# Patient Record
Sex: Female | Born: 1980 | Race: Black or African American | Hispanic: No | Marital: Married | Smoking: Never smoker
Health system: Southern US, Community
[De-identification: ages and names within clinical notes are randomized; demographics above are authoritative.]

## PROBLEM LIST (undated history)

## (undated) DIAGNOSIS — B009 Herpesviral infection, unspecified: Secondary | ICD-10-CM

## (undated) DIAGNOSIS — R519 Headache, unspecified: Secondary | ICD-10-CM

## (undated) DIAGNOSIS — D649 Anemia, unspecified: Secondary | ICD-10-CM

## (undated) DIAGNOSIS — O24419 Gestational diabetes mellitus in pregnancy, unspecified control: Secondary | ICD-10-CM

## (undated) DIAGNOSIS — B019 Varicella without complication: Secondary | ICD-10-CM

## (undated) DIAGNOSIS — E119 Type 2 diabetes mellitus without complications: Secondary | ICD-10-CM

## (undated) DIAGNOSIS — R87619 Unspecified abnormal cytological findings in specimens from cervix uteri: Secondary | ICD-10-CM

## (undated) DIAGNOSIS — R51 Headache: Secondary | ICD-10-CM

## (undated) DIAGNOSIS — IMO0002 Reserved for concepts with insufficient information to code with codable children: Secondary | ICD-10-CM

## (undated) HISTORY — DX: Reserved for concepts with insufficient information to code with codable children: IMO0002

## (undated) HISTORY — DX: Unspecified abnormal cytological findings in specimens from cervix uteri: R87.619

## (undated) HISTORY — PX: SALPINGOOPHORECTOMY: SHX82

## (undated) HISTORY — DX: Anemia, unspecified: D64.9

## (undated) HISTORY — DX: Type 2 diabetes mellitus without complications: E11.9

## (undated) HISTORY — DX: Gestational diabetes mellitus in pregnancy, unspecified control: O24.419

## (undated) HISTORY — PX: COLPOSCOPY: SHX161

## (undated) HISTORY — DX: Varicella without complication: B01.9

## (undated) HISTORY — DX: Headache: R51

## (undated) HISTORY — DX: Headache, unspecified: R51.9

## (undated) HISTORY — DX: Herpesviral infection, unspecified: B00.9

---

## 2002-05-15 ENCOUNTER — Emergency Department (HOSPITAL_COMMUNITY): Admission: EM | Admit: 2002-05-15 | Discharge: 2002-05-15 | Payer: Self-pay | Admitting: Emergency Medicine

## 2002-05-15 ENCOUNTER — Encounter: Payer: Self-pay | Admitting: Emergency Medicine

## 2004-04-10 ENCOUNTER — Emergency Department (HOSPITAL_COMMUNITY): Admission: EM | Admit: 2004-04-10 | Discharge: 2004-04-10 | Payer: Self-pay | Admitting: Emergency Medicine

## 2006-12-06 ENCOUNTER — Other Ambulatory Visit: Admission: RE | Admit: 2006-12-06 | Discharge: 2006-12-06 | Payer: Self-pay | Admitting: Obstetrics and Gynecology

## 2008-05-04 ENCOUNTER — Other Ambulatory Visit: Admission: RE | Admit: 2008-05-04 | Discharge: 2008-05-04 | Payer: Self-pay | Admitting: Obstetrics and Gynecology

## 2008-10-15 ENCOUNTER — Ambulatory Visit: Payer: Self-pay | Admitting: Physician Assistant

## 2008-10-15 ENCOUNTER — Inpatient Hospital Stay (HOSPITAL_COMMUNITY): Admission: AD | Admit: 2008-10-15 | Discharge: 2008-10-15 | Payer: Self-pay | Admitting: Obstetrics and Gynecology

## 2009-04-24 ENCOUNTER — Inpatient Hospital Stay (HOSPITAL_COMMUNITY): Admission: AD | Admit: 2009-04-24 | Discharge: 2009-04-26 | Payer: Self-pay | Admitting: Obstetrics and Gynecology

## 2009-08-05 ENCOUNTER — Other Ambulatory Visit: Admission: RE | Admit: 2009-08-05 | Discharge: 2009-08-05 | Payer: Self-pay | Admitting: Obstetrics and Gynecology

## 2009-12-27 ENCOUNTER — Ambulatory Visit: Admission: RE | Admit: 2009-12-27 | Discharge: 2009-12-27 | Payer: Self-pay

## 2009-12-27 ENCOUNTER — Ambulatory Visit: Payer: Self-pay | Admitting: Vascular Surgery

## 2009-12-30 ENCOUNTER — Encounter: Admission: RE | Admit: 2009-12-30 | Discharge: 2009-12-30 | Payer: Self-pay | Admitting: Family Medicine

## 2010-10-23 LAB — CBC
HCT: 36.7 % (ref 36.0–46.0)
Hemoglobin: 12.2 g/dL (ref 12.0–15.0)
MCHC: 33.1 g/dL (ref 30.0–36.0)
MCHC: 33.2 g/dL (ref 30.0–36.0)
MCV: 91.6 fL (ref 78.0–100.0)
Platelets: 144 10*3/uL — ABNORMAL LOW (ref 150–400)
Platelets: 163 10*3/uL (ref 150–400)
RBC: 3.41 MIL/uL — ABNORMAL LOW (ref 3.87–5.11)
RBC: 4 MIL/uL (ref 3.87–5.11)
RDW: 15.3 % (ref 11.5–15.5)
WBC: 15.1 10*3/uL — ABNORMAL HIGH (ref 4.0–10.5)
WBC: 8.5 10*3/uL (ref 4.0–10.5)

## 2010-10-23 LAB — RPR: RPR Ser Ql: NONREACTIVE

## 2010-10-27 ENCOUNTER — Other Ambulatory Visit: Payer: Self-pay | Admitting: Obstetrics and Gynecology

## 2010-10-27 ENCOUNTER — Other Ambulatory Visit (HOSPITAL_COMMUNITY)
Admission: RE | Admit: 2010-10-27 | Discharge: 2010-10-27 | Disposition: A | Discharge status: Home / Self Care | Payer: Managed Care, Other (non HMO) | Source: Ambulatory Visit | Attending: Obstetrics and Gynecology | Admitting: Obstetrics and Gynecology

## 2010-10-27 DIAGNOSIS — Z113 Encounter for screening for infections with a predominantly sexual mode of transmission: Secondary | ICD-10-CM | POA: Insufficient documentation

## 2010-10-27 DIAGNOSIS — Z01419 Encounter for gynecological examination (general) (routine) without abnormal findings: Secondary | ICD-10-CM | POA: Insufficient documentation

## 2010-10-30 LAB — GC/CHLAMYDIA PROBE AMP, GENITAL
Chlamydia, DNA Probe: NEGATIVE
GC Probe Amp, Genital: NEGATIVE

## 2010-10-30 LAB — WET PREP, GENITAL: Clue Cells Wet Prep HPF POC: NONE SEEN

## 2011-11-26 ENCOUNTER — Other Ambulatory Visit (HOSPITAL_COMMUNITY)
Admission: RE | Admit: 2011-11-26 | Discharge: 2011-11-26 | Disposition: A | Discharge status: Home / Self Care | Payer: Managed Care, Other (non HMO) | Source: Ambulatory Visit | Attending: Obstetrics and Gynecology | Admitting: Obstetrics and Gynecology

## 2011-11-26 ENCOUNTER — Other Ambulatory Visit: Payer: Self-pay | Admitting: Obstetrics and Gynecology

## 2011-11-26 DIAGNOSIS — Z01419 Encounter for gynecological examination (general) (routine) without abnormal findings: Secondary | ICD-10-CM | POA: Insufficient documentation

## 2012-07-20 NOTE — L&D Delivery Note (Signed)
Delivery Note At 12:48 PM a viable female was delivered via Vaginal, Spontaneous Delivery (Presentation: ; Occiput Anterior).  APGAR: 9, 9; weight pending.   Placenta status: Intact, Spontaneous.  Cord: 3 vessels with the following complications: none.  Cord pH: not collected  Anesthesia: Epidural  Episiotomy: None Lacerations: None Suture Repair: n/a Est. Blood Loss (mL): 200  Mom to postpartum.  Baby to skin to skin immediately after delivery.  Routine postpartum orders Plans outpatient circ Pump and bottlefeed  Matthews Franks 03/26/2013, 1:41 PM

## 2012-08-26 LAB — OB RESULTS CONSOLE GC/CHLAMYDIA
Chlamydia: NEGATIVE
Gonorrhea: NEGATIVE

## 2012-08-26 LAB — OB RESULTS CONSOLE ANTIBODY SCREEN: Antibody Screen: NEGATIVE

## 2012-08-26 LAB — OB RESULTS CONSOLE HEPATITIS B SURFACE ANTIGEN: Hepatitis B Surface Ag: NEGATIVE

## 2012-08-26 LAB — OB RESULTS CONSOLE HIV ANTIBODY (ROUTINE TESTING): HIV: NONREACTIVE

## 2012-08-26 LAB — OB RESULTS CONSOLE RUBELLA ANTIBODY, IGM: Rubella: IMMUNE

## 2013-02-01 ENCOUNTER — Encounter: Payer: Managed Care, Other (non HMO) | Attending: Obstetrics and Gynecology | Admitting: *Deleted

## 2013-02-01 VITALS — Ht 64.0 in | Wt 237.4 lb

## 2013-02-01 DIAGNOSIS — Z713 Dietary counseling and surveillance: Secondary | ICD-10-CM | POA: Insufficient documentation

## 2013-02-01 DIAGNOSIS — O24419 Gestational diabetes mellitus in pregnancy, unspecified control: Secondary | ICD-10-CM

## 2013-02-01 DIAGNOSIS — O99810 Abnormal glucose complicating pregnancy: Secondary | ICD-10-CM | POA: Insufficient documentation

## 2013-02-02 ENCOUNTER — Encounter: Payer: Self-pay | Admitting: *Deleted

## 2013-02-02 NOTE — Progress Notes (Signed)
  Patient was seen on 02/01/13 for Gestational Diabetes self-management class at the [facility]. The following learning objectives were met by the patient during this course:   States the definition of Gestational Diabetes  States why dietary management is important in controlling blood glucose  Describes the effects each nutrient has on blood glucose levels  Demonstrates ability to create a balanced meal plan  Demonstrates carbohydrate counting   States when to check blood glucose levels  Demonstrates proper blood glucose monitoring techniques  States the effect of stress and exercise on blood glucose levels  States the importance of limiting caffeine and abstaining from alcohol and smoking  Blood glucose monitor given: Accu Chek Nano BG Monitoring Kit Lot # [id number] Exp: 04/18/14 Blood glucose reading: 85 mg/dl  Patient instructed to monitor glucose levels: FBS: 60 - <90 1 hour: <140 2 hour: <120  *Patient received handouts:  Nutrition Diabetes and Pregnancy  Carbohydrate Counting List  Patient will be seen for follow-up as needed.

## 2013-02-02 NOTE — Patient Instructions (Signed)
Goals:  Check glucose levels per MD as instructed  Follow Gestational Diabetes Diet as instructed  Call for follow-up as needed    

## 2013-03-24 ENCOUNTER — Telehealth (HOSPITAL_COMMUNITY): Payer: Self-pay | Admitting: *Deleted

## 2013-03-24 ENCOUNTER — Encounter (HOSPITAL_COMMUNITY): Payer: Self-pay | Admitting: *Deleted

## 2013-03-24 NOTE — Telephone Encounter (Signed)
Preadmission screen  

## 2013-03-26 ENCOUNTER — Encounter (HOSPITAL_COMMUNITY): Payer: Self-pay | Admitting: Anesthesiology

## 2013-03-26 ENCOUNTER — Inpatient Hospital Stay (HOSPITAL_COMMUNITY)
Admission: AD | Admit: 2013-03-26 | Discharge: 2013-03-28 | DRG: 775 | Disposition: A | Discharge status: Home / Self Care | Payer: Managed Care, Other (non HMO) | Source: Ambulatory Visit | Attending: Obstetrics and Gynecology | Admitting: Obstetrics and Gynecology

## 2013-03-26 ENCOUNTER — Inpatient Hospital Stay (HOSPITAL_COMMUNITY): Payer: Managed Care, Other (non HMO) | Admitting: Anesthesiology

## 2013-03-26 ENCOUNTER — Encounter (HOSPITAL_COMMUNITY): Payer: Self-pay | Admitting: *Deleted

## 2013-03-26 DIAGNOSIS — Z9851 Tubal ligation status: Secondary | ICD-10-CM

## 2013-03-26 DIAGNOSIS — O99892 Other specified diseases and conditions complicating childbirth: Principal | ICD-10-CM | POA: Diagnosis present

## 2013-03-26 DIAGNOSIS — Z22330 Carrier of Group B streptococcus: Secondary | ICD-10-CM

## 2013-03-26 LAB — GLUCOSE, CAPILLARY
Glucose-Capillary: 83 mg/dL (ref 70–99)
Glucose-Capillary: 74 mg/dL (ref 70–99)

## 2013-03-26 LAB — CBC
HCT: 33 % — ABNORMAL LOW (ref 36.0–46.0)
MCV: 81.8 fL (ref 78.0–100.0)
Platelets: 157 10*3/uL (ref 150–400)
RBC: 3.9 MIL/uL (ref 3.87–5.11)
RDW: 15.4 % (ref 11.5–15.5)
WBC: 6.7 10*3/uL (ref 4.0–10.5)
WBC: 7.6 10*3/uL (ref 4.0–10.5)

## 2013-03-26 LAB — ABO/RH: ABO/RH(D): A POS

## 2013-03-26 LAB — TYPE AND SCREEN: ABO/RH(D): A POS

## 2013-03-26 MED ORDER — PHENYLEPHRINE 40 MCG/ML (10ML) SYRINGE FOR IV PUSH (FOR BLOOD PRESSURE SUPPORT)
80.0000 ug | PREFILLED_SYRINGE | INTRAVENOUS | Status: DC | PRN
  Filled 2013-03-26: qty 2

## 2013-03-26 MED ORDER — PHENYLEPHRINE 40 MCG/ML (10ML) SYRINGE FOR IV PUSH (FOR BLOOD PRESSURE SUPPORT)
80.0000 ug | PREFILLED_SYRINGE | INTRAVENOUS | Status: DC | PRN
  Filled 2013-03-26: qty 2
  Filled 2013-03-26: qty 5

## 2013-03-26 MED ORDER — FAMOTIDINE 20 MG PO TABS
40.0000 mg | ORAL_TABLET | Freq: Once | ORAL | Status: AC
  Administered 2013-03-28: 40 mg via ORAL
  Filled 2013-03-26: qty 2

## 2013-03-26 MED ORDER — OXYTOCIN 40 UNITS IN LACTATED RINGERS INFUSION - SIMPLE MED
1.0000 m[IU]/min | INTRAVENOUS | Status: DC
  Administered 2013-03-26: 1 m[IU]/min via INTRAVENOUS
  Administered 2013-03-26: 7 m[IU]/min via INTRAVENOUS
  Filled 2013-03-26: qty 1000

## 2013-03-26 MED ORDER — LACTATED RINGERS IV SOLN
INTRAVENOUS | Status: DC
Start: 2013-03-26 — End: 2013-03-26
  Administered 2013-03-26: 03:00:00 via INTRAVENOUS

## 2013-03-26 MED ORDER — DIBUCAINE 1 % RE OINT: 1.0000 "application " | TOPICAL_OINTMENT | RECTAL | Status: DC | PRN

## 2013-03-26 MED ORDER — PENICILLIN G POTASSIUM 5000000 UNITS IJ SOLR
5.0000 10*6.[IU] | Freq: Once | INTRAVENOUS | Status: AC
  Administered 2013-03-26: 5 10*6.[IU] via INTRAVENOUS
  Filled 2013-03-26: qty 5

## 2013-03-26 MED ORDER — WITCH HAZEL-GLYCERIN EX PADS: 1.0000 "application " | MEDICATED_PAD | CUTANEOUS | Status: DC | PRN

## 2013-03-26 MED ORDER — LANOLIN HYDROUS EX OINT: TOPICAL_OINTMENT | CUTANEOUS | Status: DC | PRN

## 2013-03-26 MED ORDER — SIMETHICONE 80 MG PO CHEW: 80.0000 mg | CHEWABLE_TABLET | ORAL | Status: DC | PRN

## 2013-03-26 MED ORDER — METOCLOPRAMIDE HCL 10 MG PO TABS
10.0000 mg | ORAL_TABLET | Freq: Once | ORAL | Status: AC
  Administered 2013-03-28: 10 mg via ORAL
  Filled 2013-03-26: qty 1

## 2013-03-26 MED ORDER — LACTATED RINGERS IV SOLN
500.0000 mL | INTRAVENOUS | Status: DC | PRN
  Administered 2013-03-26: 500 mL via INTRAVENOUS

## 2013-03-26 MED ORDER — LACTATED RINGERS IV SOLN
INTRAVENOUS | Status: DC
  Administered 2013-03-28: 11:00:00 via INTRAVENOUS

## 2013-03-26 MED ORDER — PRENATAL MULTIVITAMIN CH
1.0000 | ORAL_TABLET | Freq: Every day | ORAL | Status: DC
  Administered 2013-03-27 – 2013-03-28 (×2): 1 via ORAL
  Filled 2013-03-26 (×2): qty 1

## 2013-03-26 MED ORDER — DIPHENHYDRAMINE HCL 25 MG PO CAPS: 25.0000 mg | ORAL_CAPSULE | Freq: Four times a day (QID) | ORAL | Status: DC | PRN

## 2013-03-26 MED ORDER — FENTANYL CITRATE 0.05 MG/ML IJ SOLN
100.0000 ug | INTRAMUSCULAR | Status: DC | PRN
  Administered 2013-03-26: 100 ug via INTRAVENOUS
  Filled 2013-03-26: qty 2

## 2013-03-26 MED ORDER — EPHEDRINE 5 MG/ML INJ
10.0000 mg | INTRAVENOUS | Status: DC | PRN
  Filled 2013-03-26: qty 2
  Filled 2013-03-26: qty 4

## 2013-03-26 MED ORDER — ACETAMINOPHEN 325 MG PO TABS: 650.0000 mg | ORAL_TABLET | ORAL | Status: DC | PRN

## 2013-03-26 MED ORDER — ONDANSETRON HCL 4 MG/2ML IJ SOLN: 4.0000 mg | INTRAMUSCULAR | Status: DC | PRN

## 2013-03-26 MED ORDER — LACTATED RINGERS IV SOLN
INTRAVENOUS | Status: DC
  Administered 2013-03-26: 08:00:00 via INTRAVENOUS

## 2013-03-26 MED ORDER — ONDANSETRON HCL 4 MG PO TABS: 4.0000 mg | ORAL_TABLET | ORAL | Status: DC | PRN

## 2013-03-26 MED ORDER — LIDOCAINE HCL (PF) 1 % IJ SOLN
INTRAMUSCULAR | Status: DC | PRN
  Administered 2013-03-26 (×2): 5 mL

## 2013-03-26 MED ORDER — OXYCODONE-ACETAMINOPHEN 5-325 MG PO TABS: 1.0000 | ORAL_TABLET | ORAL | Status: DC | PRN

## 2013-03-26 MED ORDER — TERBUTALINE SULFATE 1 MG/ML IJ SOLN
INTRAMUSCULAR | Status: AC
  Administered 2013-03-26: 0.25 mg via SUBCUTANEOUS
  Filled 2013-03-26: qty 1

## 2013-03-26 MED ORDER — CITRIC ACID-SODIUM CITRATE 334-500 MG/5ML PO SOLN: 30.0000 mL | ORAL | Status: DC | PRN

## 2013-03-26 MED ORDER — EPHEDRINE 5 MG/ML INJ
10.0000 mg | INTRAVENOUS | Status: DC | PRN
  Filled 2013-03-26: qty 2

## 2013-03-26 MED ORDER — ONDANSETRON HCL 4 MG/2ML IJ SOLN
4.0000 mg | Freq: Four times a day (QID) | INTRAMUSCULAR | Status: DC | PRN
  Administered 2013-03-26: 4 mg via INTRAVENOUS
  Filled 2013-03-26: qty 2

## 2013-03-26 MED ORDER — BENZOCAINE-MENTHOL 20-0.5 % EX AERO: 1.0000 "application " | INHALATION_SPRAY | CUTANEOUS | Status: DC | PRN

## 2013-03-26 MED ORDER — OXYCODONE-ACETAMINOPHEN 5-325 MG PO TABS
1.0000 | ORAL_TABLET | ORAL | Status: DC | PRN
  Administered 2013-03-28: 2 via ORAL
  Filled 2013-03-26: qty 2

## 2013-03-26 MED ORDER — TETANUS-DIPHTH-ACELL PERTUSSIS 5-2.5-18.5 LF-MCG/0.5 IM SUSP: 0.5000 mL | Freq: Once | INTRAMUSCULAR | Status: DC

## 2013-03-26 MED ORDER — FENTANYL 2.5 MCG/ML BUPIVACAINE 1/10 % EPIDURAL INFUSION (WH - ANES)
14.0000 mL/h | INTRAMUSCULAR | Status: DC | PRN
  Administered 2013-03-26: 14 mL/h via EPIDURAL
  Filled 2013-03-26: qty 125

## 2013-03-26 MED ORDER — OXYTOCIN 40 UNITS IN LACTATED RINGERS INFUSION - SIMPLE MED: 62.5000 mL/h | INTRAVENOUS | Status: DC

## 2013-03-26 MED ORDER — IBUPROFEN 600 MG PO TABS: 600.0000 mg | ORAL_TABLET | Freq: Four times a day (QID) | ORAL | Status: DC | PRN

## 2013-03-26 MED ORDER — LACTATED RINGERS IV SOLN: 500.0000 mL | Freq: Once | INTRAVENOUS | Status: DC

## 2013-03-26 MED ORDER — OXYTOCIN BOLUS FROM INFUSION
500.0000 mL | INTRAVENOUS | Status: DC
Start: 2013-03-26 — End: 2013-03-26

## 2013-03-26 MED ORDER — IBUPROFEN 600 MG PO TABS
600.0000 mg | ORAL_TABLET | Freq: Four times a day (QID) | ORAL | Status: DC
  Administered 2013-03-26 – 2013-03-28 (×9): 600 mg via ORAL
  Filled 2013-03-26 (×10): qty 1

## 2013-03-26 MED ORDER — DIPHENHYDRAMINE HCL 50 MG/ML IJ SOLN: 12.5000 mg | INTRAMUSCULAR | Status: DC | PRN

## 2013-03-26 MED ORDER — SENNOSIDES-DOCUSATE SODIUM 8.6-50 MG PO TABS
2.0000 | ORAL_TABLET | Freq: Every day | ORAL | Status: DC
  Administered 2013-03-26 – 2013-03-27 (×2): 2 via ORAL

## 2013-03-26 MED ORDER — DEXTROSE 5 % IV SOLN
2.5000 10*6.[IU] | INTRAVENOUS | Status: DC
  Administered 2013-03-26: 2.5 10*6.[IU] via INTRAVENOUS
  Filled 2013-03-26 (×5): qty 2.5

## 2013-03-26 MED ORDER — ZOLPIDEM TARTRATE 5 MG PO TABS: 5.0000 mg | ORAL_TABLET | Freq: Every evening | ORAL | Status: DC | PRN

## 2013-03-26 MED ORDER — LIDOCAINE HCL (PF) 1 % IJ SOLN
30.0000 mL | INTRAMUSCULAR | Status: DC | PRN
  Filled 2013-03-26 (×2): qty 30

## 2013-03-26 NOTE — MAU Note (Signed)
Pt reports uc's q 4-6 minutes for 1.5 hours. Rates pain 6/10

## 2013-03-26 NOTE — Progress Notes (Signed)
  Subjective: Pt reports feeling much better now that epidural is placed.  Pitocin initiated d/t no cervical change in over 5 hours.  Pt verbalizes understanding and wishes to proceed.  Objective: BP 118/73  Pulse 75  Temp(Src) 98.8 F (37.1 C) (Oral)  Resp 20  Ht 5\' 4"  (1.626 m)  Wt 236 lb (107.049 kg)  BMI 40.49 kg/m2  LMP 06/22/2012      FHT:  Cat I UC:   irregular, every 5-7 minutes  SVE:   Dilation: 3 Effacement (%): 80 Station: -2;-3 Exam by:: McGrailRN  Assessment / Plan:  Labor: Augmented labor Preeclampsia: No s/s Fetal Wellbeing: Cat I Pain Control: Epidural I/D: GBS +; PCN initated at 0630; Intact; Afibrile  Anticipated MOD: SVD   Zoe Randall 03/26/2013, 8:52 AM

## 2013-03-26 NOTE — Anesthesia Preprocedure Evaluation (Signed)
Anesthesia Evaluation  Patient identified by MRN, date of birth, ID band Patient awake    Reviewed: Allergy & Precautions, H&P , Patient's Chart, lab work & pertinent test results  Airway Mallampati: III TM Distance: >3 FB Neck ROM: full    Dental no notable dental hx.    Pulmonary neg pulmonary ROS,  breath sounds clear to auscultation  Pulmonary exam normal       Cardiovascular negative cardio ROS  Rhythm:regular Rate:Normal     Neuro/Psych negative neurological ROS  negative psych ROS   GI/Hepatic negative GI ROS, Neg liver ROS,   Endo/Other  negative endocrine ROSdiabetesMorbid obesity  Renal/GU negative Renal ROS     Musculoskeletal   Abdominal   Peds  Hematology negative hematology ROS (+)   Anesthesia Other Findings Diabetes mellitus without complication           Gestational diabetes     Herpes    Reproductive/Obstetrics (+) Pregnancy                           Anesthesia Physical Anesthesia Plan  ASA: III  Anesthesia Plan: Epidural   Post-op Pain Management:    Induction:   Airway Management Planned:   Additional Equipment:   Intra-op Plan:   Post-operative Plan:   Informed Consent: I have reviewed the patients History and Physical, chart, labs and discussed the procedure including the risks, benefits and alternatives for the proposed anesthesia with the patient or authorized representative who has indicated his/her understanding and acceptance.     Plan Discussed with:   Anesthesia Plan Comments:         Anesthesia Quick Evaluation

## 2013-03-26 NOTE — MAU Note (Signed)
Plan of care d/w Lillard. HAve pt walk for 1-2 hours then return to MAU for evaluation

## 2013-03-26 NOTE — H&P (Signed)
Zoe Randall is a 32 y.o. female presenting for labor eval. Pt reports some bloody show, denies any LOF, +FM. Cervi initially 1.5cm/thick and posterior, after ambulating rehceck of cervix =3cm/60cm and less posterior. Pt requesting epidural  HPI: Pt began [facility] w Dr Richardson Dopp at   [facility] History:  Reason for admission: Contractions.   Contractions: Onset was 6-12 hours ago.   Frequency: regular.   Duration is approximately 60 seconds.   Perceived severity is moderate.    Fetal activity: Perceived fetal activity is normal.   Last perceived fetal movement was within the past hour.    Prenatal Complications - Diabetes: gestational. Diabetes is managed by diet.      OB History   Grav Para Term Preterm Abortions TAB SAB Ect Mult Living   3 2 2       2      G1 -  G2 -  G3 - current preg   Past Medical History  Diagnosis Date  . Diabetes mellitus without complication   . Abnormal Pap smear   . Gestational diabetes   . Herpes    Past Surgical History  Procedure Laterality Date  . Colposcopy     Family History: family history includes Hypertension in her maternal grandmother. Social History:  reports that she has never smoked. She does not have any smokeless tobacco history on file. She reports that she does not drink alcohol or use illicit drugs.   Prenatal Transfer Tool  Maternal Diabetes: Yes:  Diabetes Type:  Diet controlled Genetic Screening: Declined Maternal Ultrasounds/Referrals: Normal Fetal Ultrasounds or other Referrals:  None Maternal Substance Abuse:  No Significant Maternal Medications:  Meds include: Other: valtrex  Significant Maternal Lab Results:  Lab values include: Group B Strep positive Other Comments:  None  Review of Systems  All other systems reviewed and are negative.    Dilation: 3 Effacement (%): 60 Station: -2 Exam by:: B Mosca  Blood pressure 114/77, pulse 73, temperature 97.3 F (36.3 C), temperature source Oral, resp. rate  18, height 5\' 3"  (1.6 m), weight 237 lb (107.502 kg), last menstrual period 06/22/2012. Maternal Exam:  Uterine Assessment: Contraction strength is moderate.  Contraction duration is 60 seconds. Contraction frequency is regular.   Abdomen: Patient reports no abdominal tenderness. Fundal height is aga.   Estimated fetal weight is 7-8#.   Fetal presentation: vertex  Introitus: Normal vulva. Normal vagina.  Spec exam, no evidence of any HSV lesions   Pelvis: adequate for delivery.   Cervix: Cervix evaluated by sterile speculum exam and digital exam.     Fetal Exam Fetal Monitor Review: Mode: ultrasound.   Baseline rate: 140.  Variability: moderate (6-25 bpm).   Pattern: accelerations present and no decelerations.    Fetal State Assessment: Category I - tracings are normal.     Physical Exam  Nursing note and vitals reviewed. Constitutional: She is oriented to person, place, and time. She appears well-developed and well-nourished. She appears distressed.  HENT:  Head: Normocephalic.  Eyes: Pupils are equal, round, and reactive to light.  Neck: Normal range of motion.  Cardiovascular: Normal rate, regular rhythm and normal heart sounds.   Respiratory: Effort normal and breath sounds normal.  GI: Soft. Bowel sounds are normal.  Genitourinary: Vagina normal.  Musculoskeletal: Normal range of motion.  Neurological: She is alert and oriented to person, place, and time. She has normal reflexes.  Skin: Skin is warm and dry.  Psychiatric: She has a normal mood and affect. Her behavior  is normal.    Prenatal labs: ABO, Rh: A/Positive/-- (02/07 0000) Antibody: Negative (02/07 0000) Rubella: Immune (02/07 0000) RPR: Nonreactive (02/07 0000)  HBsAg: Negative (02/07 0000)  HIV: Non-reactive (02/07 0000)  GBS: Positive (08/11 0000)   Assessment/Plan: IUP at [redacted]w[redacted]d GBS pos FHR reassuring GDM - diet controlled Early labor Hx HSV 2  Admit to b.s. Per c/w Dr Pennie Rushing Routine L&D  orders CBG q4h Epidural now per pt request Expectant mgmnt at present    Zykeria Laguardia M 03/26/2013, 2:41 AM

## 2013-03-26 NOTE — Anesthesia Procedure Notes (Signed)
Epidural Patient location during procedure: OB Start time: 03/26/2013 7:43 AM  Staffing Anesthesiologist: Angus Seller., Harrell Gave. Performed by: anesthesiologist   Preanesthetic Checklist Completed: patient identified, site marked, surgical consent, pre-op evaluation, timeout performed, IV checked, risks and benefits discussed and monitors and equipment checked  Epidural Patient position: sitting Prep: site prepped and draped and DuraPrep Patient monitoring: continuous pulse ox and blood pressure Approach: midline Injection technique: LOR air and LOR saline  Needle:  Needle type: Tuohy  Needle gauge: 17 G Needle length: 9 cm and 9 Needle insertion depth: 7 cm Catheter type: closed end flexible Catheter size: 19 Gauge Catheter at skin depth: 12 cm Test dose: negative  Assessment Events: blood not aspirated, injection not painful, no injection resistance, negative IV test and no paresthesia  Additional Notes Patient identified.  Risk benefits discussed including failed block, incomplete pain control, headache, nerve damage, paralysis, blood pressure changes, nausea, vomiting, reactions to medication both toxic or allergic, and postpartum back pain.  Patient expressed understanding and wished to proceed.  All questions were answered.  Sterile technique used throughout procedure and epidural site dressed with sterile barrier dressing. No paresthesia or other complications noted.The patient did not experience any signs of intravascular injection such as tinnitus or metallic taste in mouth nor signs of intrathecal spread such as rapid motor block. Please see nursing notes for vital signs.

## 2013-03-27 LAB — CBC
MCH: 27.7 pg (ref 26.0–34.0)
MCV: 82.5 fL (ref 78.0–100.0)
Platelets: 162 10*3/uL (ref 150–400)
RBC: 3.65 MIL/uL — ABNORMAL LOW (ref 3.87–5.11)

## 2013-03-27 LAB — GLUCOSE, CAPILLARY: Glucose-Capillary: 90 mg/dL (ref 70–99)

## 2013-03-27 MED ORDER — MIDAZOLAM HCL 2 MG/2ML IJ SOLN
INTRAMUSCULAR | Status: AC
  Filled 2013-03-27: qty 2

## 2013-03-27 MED ORDER — DEXAMETHASONE SODIUM PHOSPHATE 10 MG/ML IJ SOLN
INTRAMUSCULAR | Status: AC
  Filled 2013-03-27: qty 1

## 2013-03-27 MED ORDER — ONDANSETRON HCL 4 MG/2ML IJ SOLN
INTRAMUSCULAR | Status: AC
  Filled 2013-03-27: qty 2

## 2013-03-27 MED ORDER — PROPOFOL 10 MG/ML IV EMUL
INTRAVENOUS | Status: AC
  Filled 2013-03-27: qty 20

## 2013-03-27 MED ORDER — FENTANYL CITRATE 0.05 MG/ML IJ SOLN
INTRAMUSCULAR | Status: AC
  Filled 2013-03-27: qty 2

## 2013-03-27 MED ORDER — LIDOCAINE HCL (CARDIAC) 20 MG/ML IV SOLN
INTRAVENOUS | Status: AC
  Filled 2013-03-27: qty 5

## 2013-03-27 NOTE — Progress Notes (Signed)
Post Partum Day 1 Subjective: no complaints Ok to proceed with salpingectomy, possible minilap prn.  Ok to proceed with Tubal tomorrow afternoon. Objective: Blood pressure 130/60, pulse 70, temperature 98 F (36.7 C), temperature source Oral, resp. rate 22, height 5' 4" (1.626 m), weight 107.049 kg (236 lb), last menstrual period 06/22/2012, SpO2 99.00%, unknown if currently breastfeeding.  Physical Exam:  General: alert, cooperative and no distress Lochia: appropriate Uterine Fundus: firm Incision: n/a DVT Evaluation: Calf/Ankle edema is present.   Recent Labs  03/26/13 0714 03/27/13 0610  HGB 10.9* 10.1*  HCT 31.9* 30.1*    Assessment/Plan: Plan for discharge tomorrow, Breastfeeding and Lactation consult Outpatient circumcision.   Postpartum salpingectomy tomorrow. NPO after midnight.  LOS: 1 day   Rumeal Cullipher 03/27/2013, 10:07 AM    

## 2013-03-27 NOTE — Anesthesia Preprocedure Evaluation (Addendum)
Anesthesia Evaluation  Patient identified by MRN, date of birth, ID band Patient awake    Reviewed: Allergy & Precautions, H&P , Patient's Chart, lab work & pertinent test results  Airway Mallampati: III TM Distance: >3 FB Neck ROM: full    Dental no notable dental hx.    Pulmonary neg pulmonary ROS,  breath sounds clear to auscultation  Pulmonary exam normal       Cardiovascular negative cardio ROS  Rhythm:regular Rate:Normal     Neuro/Psych negative neurological ROS  negative psych ROS   GI/Hepatic negative GI ROS, Neg liver ROS,   Endo/Other  diabetesMorbid obesity  Renal/GU negative Renal ROS     Musculoskeletal   Abdominal   Peds  Hematology negative hematology ROS (+)   Anesthesia Other Findings Diabetes mellitus without complication           Gestational diabetes     Herpes    Reproductive/Obstetrics                          Anesthesia Physical  Anesthesia Plan  ASA: III  Anesthesia Plan: Spinal   Post-op Pain Management:    Induction:   Airway Management Planned:   Additional Equipment:   Intra-op Plan:   Post-operative Plan:   Informed Consent: I have reviewed the patients History and Physical, chart, labs and discussed the procedure including the risks, benefits and alternatives for the proposed anesthesia with the patient or authorized representative who has indicated his/her understanding and acceptance.     Plan Discussed with: Surgeon and CRNA  Anesthesia Plan Comments:         Anesthesia Quick Evaluation                                   Anesthesia Evaluation  Patient identified by MRN, date of birth, ID band Patient awake    Reviewed: Allergy & Precautions, H&P , Patient's Chart, lab work & pertinent test results  Airway Mallampati: III TM Distance: >3 FB Neck ROM: full    Dental no notable dental hx.    Pulmonary neg pulmonary ROS,   breath sounds clear to auscultation  Pulmonary exam normal       Cardiovascular negative cardio ROS  Rhythm:regular Rate:Normal     Neuro/Psych negative neurological ROS  negative psych ROS   GI/Hepatic negative GI ROS, Neg liver ROS,   Endo/Other  negative endocrine ROSdiabetesMorbid obesity  Renal/GU negative Renal ROS     Musculoskeletal   Abdominal   Peds  Hematology negative hematology ROS (+)   Anesthesia Other Findings Diabetes mellitus without complication           Gestational diabetes     Herpes    Reproductive/Obstetrics (+) Pregnancy                           Anesthesia Physical Anesthesia Plan  ASA: III  Anesthesia Plan: Epidural   Post-op Pain Management:    Induction:   Airway Management Planned:   Additional Equipment:   Intra-op Plan:   Post-operative Plan:   Informed Consent: I have reviewed the patients History and Physical, chart, labs and discussed the procedure including the risks, benefits and alternatives for the proposed anesthesia with the patient or authorized representative who has indicated his/her understanding and acceptance.     Plan Discussed with:   Anesthesia  Plan Comments:         Anesthesia Quick Evaluation

## 2013-03-28 ENCOUNTER — Encounter (HOSPITAL_COMMUNITY): Payer: Self-pay | Admitting: Registered Nurse

## 2013-03-28 ENCOUNTER — Encounter (HOSPITAL_COMMUNITY)
Admission: AD | Disposition: A | Discharge status: Home / Self Care | Payer: Self-pay | Source: Ambulatory Visit | Attending: Obstetrics and Gynecology

## 2013-03-28 ENCOUNTER — Encounter (HOSPITAL_COMMUNITY): Payer: Self-pay | Admitting: Anesthesiology

## 2013-03-28 ENCOUNTER — Inpatient Hospital Stay (HOSPITAL_COMMUNITY): Payer: Managed Care, Other (non HMO) | Admitting: Anesthesiology

## 2013-03-28 HISTORY — PX: TUBAL LIGATION: SHX77

## 2013-03-28 LAB — GLUCOSE, CAPILLARY: Glucose-Capillary: 70 mg/dL (ref 70–99)

## 2013-03-28 SURGERY — LIGATION, FALLOPIAN TUBE, POSTPARTUM
Anesthesia: Spinal | Laterality: Bilateral

## 2013-03-28 MED ORDER — ONDANSETRON HCL 4 MG/2ML IJ SOLN
INTRAMUSCULAR | Status: DC | PRN
  Administered 2013-03-28: 4 mg via INTRAVENOUS

## 2013-03-28 MED ORDER — LIDOCAINE HCL (CARDIAC) 20 MG/ML IV SOLN
INTRAVENOUS | Status: DC | PRN
  Administered 2013-03-28: 50 mg via INTRAVENOUS

## 2013-03-28 MED ORDER — MIDAZOLAM HCL 2 MG/2ML IJ SOLN
2.0000 mg | Freq: Once | INTRAMUSCULAR | Status: AC
  Administered 2013-03-28: 2 mg via INTRAVENOUS

## 2013-03-28 MED ORDER — FENTANYL CITRATE 0.05 MG/ML IJ SOLN
INTRAMUSCULAR | Status: AC
  Filled 2013-03-28: qty 2

## 2013-03-28 MED ORDER — KETOROLAC TROMETHAMINE 30 MG/ML IJ SOLN
INTRAMUSCULAR | Status: AC
  Filled 2013-03-28: qty 1

## 2013-03-28 MED ORDER — ONDANSETRON HCL 4 MG/2ML IJ SOLN
INTRAMUSCULAR | Status: AC
  Filled 2013-03-28: qty 2

## 2013-03-28 MED ORDER — CEFAZOLIN SODIUM-DEXTROSE 2-3 GM-% IV SOLR
INTRAVENOUS | Status: AC
  Filled 2013-03-28: qty 50

## 2013-03-28 MED ORDER — FENTANYL CITRATE 0.05 MG/ML IJ SOLN
INTRAMUSCULAR | Status: DC | PRN
  Administered 2013-03-28 (×2): 50 ug via INTRAVENOUS

## 2013-03-28 MED ORDER — MIDAZOLAM HCL 5 MG/5ML IJ SOLN
INTRAMUSCULAR | Status: DC | PRN
  Administered 2013-03-28: 2 mg via INTRAVENOUS

## 2013-03-28 MED ORDER — BUPIVACAINE HCL (PF) 0.25 % IJ SOLN
INTRAMUSCULAR | Status: DC | PRN
  Administered 2013-03-28: 10 mL

## 2013-03-28 MED ORDER — FENTANYL CITRATE 0.05 MG/ML IJ SOLN
25.0000 ug | INTRAMUSCULAR | Status: DC | PRN
  Administered 2013-03-28 (×2): 50 ug via INTRAVENOUS

## 2013-03-28 MED ORDER — BUPIVACAINE HCL (PF) 0.25 % IJ SOLN
INTRAMUSCULAR | Status: AC
  Filled 2013-03-28: qty 30

## 2013-03-28 MED ORDER — BUPIVACAINE IN DEXTROSE 0.75-8.25 % IT SOLN
INTRATHECAL | Status: DC | PRN
  Administered 2013-03-28: 10 mg via INTRATHECAL

## 2013-03-28 MED ORDER — PROPOFOL 10 MG/ML IV BOLUS
INTRAVENOUS | Status: DC | PRN
  Administered 2013-03-28 (×2): 20 mg via INTRAVENOUS

## 2013-03-28 MED ORDER — MIDAZOLAM HCL 2 MG/2ML IJ SOLN
INTRAMUSCULAR | Status: AC
  Filled 2013-03-28: qty 2

## 2013-03-28 MED ORDER — FENTANYL CITRATE 0.05 MG/ML IJ SOLN
INTRAMUSCULAR | Status: AC
  Administered 2013-03-28: 50 ug via INTRAVENOUS
  Filled 2013-03-28: qty 2

## 2013-03-28 MED ORDER — KETOROLAC TROMETHAMINE 30 MG/ML IJ SOLN
INTRAMUSCULAR | Status: DC | PRN
  Administered 2013-03-28: 30 mg via INTRAVENOUS

## 2013-03-28 MED ORDER — CEFAZOLIN SODIUM-DEXTROSE 2-3 GM-% IV SOLR
INTRAVENOUS | Status: DC | PRN
  Administered 2013-03-28: 2 g via INTRAVENOUS

## 2013-03-28 MED ORDER — OXYCODONE-ACETAMINOPHEN 5-325 MG PO TABS: 1.0000 | ORAL_TABLET | ORAL | Status: DC | PRN

## 2013-03-28 MED ORDER — OXYCODONE-ACETAMINOPHEN 5-325 MG PO TABS
1.0000 | ORAL_TABLET | ORAL | Status: DC | PRN
Start: 2013-03-28 — End: 2016-10-19

## 2013-03-28 MED ORDER — IBUPROFEN 600 MG PO TABS: 600.0000 mg | ORAL_TABLET | Freq: Four times a day (QID) | ORAL | Status: DC

## 2013-03-28 MED ORDER — PROPOFOL 10 MG/ML IV EMUL
INTRAVENOUS | Status: AC
  Filled 2013-03-28: qty 20

## 2013-03-28 MED ORDER — LIDOCAINE HCL (CARDIAC) 20 MG/ML IV SOLN
INTRAVENOUS | Status: AC
  Filled 2013-03-28: qty 5

## 2013-03-28 SURGICAL SUPPLY — 22 items
ADH SKN CLS APL DERMABOND .7 (GAUZE/BANDAGES/DRESSINGS)
CHLORAPREP W/TINT 26ML (MISCELLANEOUS) ×2 IMPLANT
CLOTH BEACON ORANGE TIMEOUT ST (SAFETY) ×2 IMPLANT
DERMABOND ADVANCED (GAUZE/BANDAGES/DRESSINGS)
DERMABOND ADVANCED .7 DNX12 (GAUZE/BANDAGES/DRESSINGS) IMPLANT
GLOVE BIO SURGEON STRL SZ7 (GLOVE) ×2 IMPLANT
GLOVE BIOGEL PI IND STRL 7.0 (GLOVE) ×1 IMPLANT
GLOVE BIOGEL PI INDICATOR 7.0 (GLOVE) ×1
GOWN PREVENTION PLUS LG XLONG (DISPOSABLE) ×4 IMPLANT
NEEDLE HYPO 22GX1.5 SAFETY (NEEDLE) ×2 IMPLANT
NS IRRIG 1000ML POUR BTL (IV SOLUTION) ×2 IMPLANT
PACK ABDOMINAL MINOR (CUSTOM PROCEDURE TRAY) ×2 IMPLANT
PENCIL BUTTON HOLSTER BLD 10FT (ELECTRODE) ×2 IMPLANT
SPONGE LAP 4X18 X RAY DECT (DISPOSABLE) IMPLANT
SUT MNCRL AB 4-0 PS2 18 (SUTURE) ×4 IMPLANT
SUT PLAIN 0 NONE (SUTURE) ×4 IMPLANT
SUT VIC AB 0 CT1 27 (SUTURE) ×2
SUT VIC AB 0 CT1 27XBRD ANBCTR (SUTURE) ×1 IMPLANT
SYR CONTROL 10ML LL (SYRINGE) ×2 IMPLANT
TOWEL OR 17X24 6PK STRL BLUE (TOWEL DISPOSABLE) ×4 IMPLANT
TRAY FOLEY CATH 14FR (SET/KITS/TRAYS/PACK) ×2 IMPLANT
WATER STERILE IRR 1000ML POUR (IV SOLUTION) ×2 IMPLANT

## 2013-03-28 NOTE — Transfer of Care (Signed)
Immediate Anesthesia Transfer of Care Note  Patient: Zoe Randall  Procedure(s) Performed: Procedure(s): POST PARTUM TUBAL LIGATION (Bilateral)  Patient Location: PACU  Anesthesia Type:Spinal  Level of Consciousness: awake, alert  and oriented  Airway & Oxygen Therapy: Patient Spontanous Breathing and Patient connected to nasal cannula oxygen  Post-op Assessment: Report given to PACU RN and Post -op Vital signs reviewed and stable  Post vital signs: Reviewed and stable  Complications: No apparent anesthesia complications

## 2013-03-28 NOTE — Preoperative (Signed)
Beta Blockers   Reason not to administer Beta Blockers:Not Applicable 

## 2013-03-28 NOTE — Discharge Summary (Signed)
Obstetric Discharge Summary Reason for Admission: onset of labor Prenatal Procedures: ultrasound Intrapartum Procedures: spontaneous vaginal delivery Postpartum Procedures: P.P. tubal ligation Complications-Operative and Postpartum: none Hemoglobin  Date Value Range Status  03/27/2013 10.1* 12.0 - 15.0 g/dL Final     HCT  Date Value Range Status  03/27/2013 30.1* 36.0 - 46.0 % Final    Physical Exam:  General: alert, cooperative and no distress Lochia: appropriate Uterine Fundus: firm Incision: n/a DVT Evaluation: No evidence of DVT seen on physical exam. Calf/Ankle edema is present.  Discharge Diagnoses: Term Pregnancy-delivered  Discharge Information: Date: 03/28/2013 Activity: pelvic rest, no heavy lifting Diet: routine Medications: PNV, Ibuprofen and Percocet Condition: stable Instructions: See discharge instructions. Discharge to: home Follow-up Information   Follow up with Jessee Avers., MD. Schedule an appointment as soon as possible for a visit in 2 weeks. (F/u postpartum tubal ligation)    Specialty:  Obstetrics and Gynecology   Contact information:   [address] [place] [address] [phone]       Newborn Data: Live born female  Birth Weight: 7 lb 1 oz (3204 g) APGAR: 9, 9  Home with mother. Outpatient circumcision to be done with [facility].  Geryl Rankins 03/28/2013, 2:44 PM

## 2013-03-28 NOTE — Anesthesia Procedure Notes (Signed)

## 2013-03-28 NOTE — Brief Op Note (Signed)
03/26/2013 - 03/28/2013  2:48 PM  PATIENT:  Zoe Randall  32 y.o. female  PRE-OPERATIVE DIAGNOSIS:  desires sterilization, s/p SVD PPD #2  POST-OPERATIVE DIAGNOSIS:  same  PROCEDURE:  Procedure(s): POST PARTUM TUBAL LIGATION (Bilateral) Postpartum salpingectomy, bilateral  SURGEON:  Surgeon(s) and Role:    * Geryl Rankins, MD - Primary  PHYSICIAN ASSISTANT: None  ASSISTANTS: Technician   ANESTHESIA:   spinal  EBL:  Total I/O In: 800 [I.V.:800] Out: 100 [Urine:100]  BLOOD ADMINISTERED:none  DRAINS: Urinary Catheter (Foley)   LOCAL MEDICATIONS USED:  MARCAINE     SPECIMEN:  Source of Specimen:  bilateral fallopian tubes  DISPOSITION OF SPECIMEN:  PATHOLOGY  COUNTS:  YES  TOURNIQUET:  * No tourniquets in log *  DICTATION: .Other Dictation: Dictation Number [id number]  PLAN OF CARE: To mother baby after PACU for discharge  PATIENT DISPOSITION:  PACU - hemodynamically stable.   Delay start of Pharmacological VTE agent (>24hrs) due to surgical blood loss or risk of bleeding: not applicable

## 2013-03-28 NOTE — H&P (View-Only) (Signed)
Post Partum Day 1 Subjective: no complaints Ok to proceed with salpingectomy, possible minilap prn.  Ok to proceed with Tubal tomorrow afternoon. Objective: Blood pressure 130/60, pulse 70, temperature 98 F (36.7 C), temperature source Oral, resp. rate 22, height 5\' 4"  (1.626 m), weight 107.049 kg (236 lb), last menstrual period 06/22/2012, SpO2 99.00%, unknown if currently breastfeeding.  Physical Exam:  General: alert, cooperative and no distress Lochia: appropriate Uterine Fundus: firm Incision: n/a DVT Evaluation: Calf/Ankle edema is present.   Recent Labs  03/26/13 0714 03/27/13 0610  HGB 10.9* 10.1*  HCT 31.9* 30.1*    Assessment/Plan: Plan for discharge tomorrow, Breastfeeding and Lactation consult Outpatient circumcision.   Postpartum salpingectomy tomorrow. NPO after midnight.  LOS: 1 day   Jerae Izard 03/27/2013, 10:07 AM

## 2013-03-28 NOTE — Anesthesia Postprocedure Evaluation (Signed)
  Anesthesia Post Note  Patient: Zoe Randall  Procedure(s) Performed: Procedure(s) (LRB): POST PARTUM TUBAL LIGATION (Bilateral)  Anesthesia type: Spinal  Patient location: PACU  Post pain: Pain level controlled  Post assessment: Post-op Vital signs reviewed  Last Vitals:  Filed Vitals:   03/28/13 1445  BP: 116/84  Pulse: 73  Temp:   Resp: 14    Post vital signs: Reviewed  Level of consciousness: awake  Complications: No apparent anesthesia complications

## 2013-03-28 NOTE — Interval H&P Note (Signed)
History and Physical Interval Note:  03/28/2013 1:20 PM  Zoe Randall  has presented today for surgery, with the diagnosis of desires sterilization  The various methods of treatment have been discussed with the patient and family. After consideration of risks, benefits and other options for treatment, the patient has consented to  Procedure(s): POST PARTUM TUBAL LIGATION (Bilateral) as a surgical intervention .  The patient's history has been reviewed, patient examined, no change in status, stable for surgery.  I have reviewed the patient's chart and labs.  Questions were answered to the patient's satisfaction.     Dion Body, Buelah Rennie

## 2013-03-28 NOTE — Anesthesia Postprocedure Evaluation (Signed)
  Anesthesia Post-op Note  Patient: Zoe Randall  Procedure(s) Performed: * No procedures listed *  Patient Location: PACU and Mother/Baby  Anesthesia Type:Epidural  Level of Consciousness: awake, alert  and oriented  Airway and Oxygen Therapy: Patient Spontanous Breathing  Post-op Pain: none  Post-op Assessment: Post-op Vital signs reviewed, Patient's Cardiovascular Status Stable, No headache, No backache, No residual numbness and No residual motor weakness  Post-op Vital Signs: Reviewed and stable  Complications: No apparent anesthesia complications

## 2013-03-29 ENCOUNTER — Encounter (HOSPITAL_COMMUNITY): Payer: Self-pay | Admitting: Obstetrics and Gynecology

## 2013-03-29 LAB — MRSA CULTURE

## 2013-03-29 NOTE — Op Note (Signed)
Zoe Randall, Zoe Randall NO.:  [id number]  MEDICAL RECORD NO.:  [id number]  LOCATION:  9102                          FACILITY:  [facility]  PHYSICIAN:  Pieter Partridge, MD   DATE OF BIRTH:  [birth year]  DATE OF PROCEDURE: DATE OF DISCHARGE:  03/28/2013                              OPERATIVE REPORT   PREOPERATIVE DIAGNOSIS:  Desired sterilization and status post spontaneous vaginal delivery postpartum day #2.  POSTOPERATIVE DIAGNOSIS:  Desired sterilization and status post spontaneous vaginal delivery postpartum day #2.  PROCEDURE:  Postpartum salpingectomy, bilateral.  SURGEON:  Pieter Partridge, MD.  PHYSICIAN ASSISTANT:  None.  Financial trader.  ANESTHESIA:  Spinal.  URINE OUTPUT:  100 mL of clear urine and 800.  ESTIMATED BLOOD LOSS:  Minimal.  BLOOD ADMINISTERED:  None.  DRAINS:  Foley catheter.  Local was 0.25% Marcaine.  SPECIMENS:  Bilateral fallopian tubes.  DISPOSITION:  Specimen to Pathology.  COUNTS:  Correct.  DISPOSITION:  To PACU, hemodynamically stable.  COMPLICATIONS:  None.  FINDINGS:  Normal fallopian tubes bilaterally.  PROCEDURE IN DETAIL:  Ms. Orvan Falconer was consented for postpartum sterilization on postpartum day #1.  Discussed tubal ligation versus salpingectomy, and the patient desires salpingectomy.  Understood that there would be no opportunity for reversal of the procedure which the patient accepted and was very pleased with that outcome.  The patient was identified in the holding area.  She was then taken to the operating room, and underwent spinal anesthesia without complication.  She was then placed in the dorsal supine position.  Foley catheter was placed and then she was prepped and draped in a normal sterile fashion.  After adequate anesthesia was confirmed with the Allis clamp, 0.25% Marcaine was injected in the peri-infraumbilical fold.  Scalpel was then used to incise the skin and the subcutaneous space.  The  subcutaneous space was then opened with a Kelly clamps down to the fascia.  The fascia was then grasped with the Kocher clamps x2 and incised with a scalpel.  Both fascial edges were tagged with 0 Vicryl.  The peritoneum was identified and that was grasped with the Kelly clamps and entered sharply with the Metzenbaum scissors.  An intra-abdominal access was confirmed.  A moistened laparotomy 4 x 18 sponge was placed into the abdomen and the patient was placed in Trendelenburg.  The left fallopian tube was identified and grasped with the Babcock and followed out to the fimbriated end.  Two Kelly clamps were used to grasp the fallopian tubes that was then suture ligated with 0 plain gut twice. The second was a Heaney stitch.  The fallopian tube was then transected with the Mayo scissors and passed off.  The pedicle was then cauterized with the Bovie and then it was returned to the abdomen.  No bleeding was noted.  The same was done on the right-hand side.  After completion of the salpingectomy, the abdomen was explored to look at the pedicles to make sure there was no bleeding.  The laparotomy sponge was then removed from the abdomen.  The fascial tags were then used for retraction and the fascia was clearly identified.  The fascial incision was then closed with  0 Vicryl in continuous running fashion.  The skin was then reapproximated with 4-0 Monocryl and then Dermabond was applied to the incision with a pressure dressing.  All instruments, sponge, and needle counts were correct x3.  Ancef was given prior to the incision.  She had SCDs on throughout the case, and a time-out was performed prior to starting the procedure.  The patient was awakened in the operating room and taken to the recovery room in stable condition.     Pieter Partridge, MD     EBV/MEDQ  D:  03/28/2013  T:  03/29/2013  Job:  [id number]

## 2013-04-04 ENCOUNTER — Inpatient Hospital Stay (HOSPITAL_COMMUNITY): Admission: RE | Admit: 2013-04-04 | Payer: Managed Care, Other (non HMO) | Source: Ambulatory Visit

## 2014-05-21 ENCOUNTER — Encounter (HOSPITAL_COMMUNITY): Payer: Self-pay | Admitting: Obstetrics and Gynecology

## 2016-10-19 ENCOUNTER — Encounter: Payer: Self-pay | Admitting: Family Medicine

## 2016-10-19 ENCOUNTER — Ambulatory Visit (INDEPENDENT_AMBULATORY_CARE_PROVIDER_SITE_OTHER): Payer: Managed Care, Other (non HMO) | Admitting: Family Medicine

## 2016-10-19 VITALS — BP 124/80 | HR 78 | Resp 12 | Ht 64.0 in | Wt 226.1 lb

## 2016-10-19 DIAGNOSIS — R079 Chest pain, unspecified: Secondary | ICD-10-CM

## 2016-10-19 DIAGNOSIS — Z6838 Body mass index (BMI) 38.0-38.9, adult: Secondary | ICD-10-CM | POA: Insufficient documentation

## 2016-10-19 DIAGNOSIS — Z8632 Personal history of gestational diabetes: Secondary | ICD-10-CM | POA: Diagnosis not present

## 2016-10-19 DIAGNOSIS — G43109 Migraine with aura, not intractable, without status migrainosus: Secondary | ICD-10-CM

## 2016-10-19 DIAGNOSIS — E669 Obesity, unspecified: Secondary | ICD-10-CM | POA: Insufficient documentation

## 2016-10-19 DIAGNOSIS — K219 Gastro-esophageal reflux disease without esophagitis: Secondary | ICD-10-CM | POA: Diagnosis not present

## 2016-10-19 DIAGNOSIS — E661 Drug-induced obesity: Secondary | ICD-10-CM

## 2016-10-19 DIAGNOSIS — Z13220 Encounter for screening for lipoid disorders: Secondary | ICD-10-CM

## 2016-10-19 LAB — LIPID PANEL
CHOL/HDL RATIO: 2
Cholesterol: 133 mg/dL (ref 0–200)
HDL: 53.5 mg/dL (ref >39.00)
LDL Cholesterol: 66 mg/dL (ref 0–99)
NONHDL: 79.13
Triglycerides: 65 mg/dL (ref 0.0–149.0)
VLDL: 13 mg/dL (ref 0.0–40.0)

## 2016-10-19 LAB — BASIC METABOLIC PANEL
BUN: 17 mg/dL (ref 6–23)
CHLORIDE: 109 meq/L (ref 96–112)
CO2: 24 meq/L (ref 19–32)
CREATININE: 0.74 mg/dL (ref 0.40–1.20)
Calcium: 8.5 mg/dL (ref 8.4–10.5)
GFR: 115.18 mL/min (ref >60.00)
GLUCOSE: 96 mg/dL (ref 70–99)
Potassium: 4.4 mEq/L (ref 3.5–5.1)
Sodium: 140 mEq/L (ref 135–145)

## 2016-10-19 LAB — HEMOGLOBIN A1C: Hgb A1c MFr Bld: 6.1 % (ref 4.6–6.5)

## 2016-10-19 LAB — TSH: TSH: 1.3 u[IU]/mL (ref 0.35–4.50)

## 2016-10-19 MED ORDER — OMEPRAZOLE 20 MG PO CPDR: 20.0000 mg | DELAYED_RELEASE_CAPSULE | Freq: Every day | ORAL | 1 refills | Status: DC

## 2016-10-19 NOTE — Progress Notes (Signed)
HPI:   [name] is a 36 y.o. female, who is here today to establish care with me.  Former PCP: Ms Zoe Kuba, NP Last preventive routine visit: She follows with gyn periodicaly.  Chronic medical problems: Hx of gestational diabetes and migraines.   Concerns today: Chest pain.  For about 3-4 months intermittent left-sided chest pain, occasionally radiated to LUE. She notices more when she is at work,sitting. 5/10 ("annoying thing"), pain lasts all day, it is dull/achy. She has not taken OTC treatment.  No Hx of HLD,DM,HTN,or tobacco use.  + Heartburn, exacerbated by fried food,spicy.  Denies abdominal pain, nausea, vomiting, changes in bowel habits, blood in stool or melena. Occasionally constipation.  + Fatigue. She does not follow a healthy diet and does not exercise regularly.  -Hx of migraine headaches, usually associated with menstrual periods.Also exacerbated by poor sleep (Hx of insomnia) Precede by visual aura, "glary vision" for 20-30 min,usually right eye and headache on left temporofrontal side.+ Photophobia and occasionally nausea. Headache usually last < 24 hours but can re-occurred while she is having menses.   Review of Systems  Constitutional: Positive for fatigue. Negative for activity change, appetite change, fever and unexpected weight change.  HENT: Negative for mouth sores, nosebleeds, sore throat and trouble swallowing.   Eyes: Negative for pain, discharge and redness.  Respiratory: Negative for cough, shortness of breath and wheezing.   Cardiovascular: Negative for chest pain, palpitations and leg swelling.  Gastrointestinal: Negative for abdominal pain, blood in stool, nausea and vomiting.       Negative for changes in bowel habits.  Endocrine: Negative for cold intolerance, heat intolerance, polydipsia, polyphagia and polyuria.  Genitourinary: Negative for decreased urine volume and hematuria.  Musculoskeletal: Negative for back  pain and neck pain.  Skin: Negative for rash.  Neurological: Positive for headaches. Negative for syncope, weakness and numbness.  Hematological: Negative for adenopathy. Does not bruise/bleed easily.  Psychiatric/Behavioral: Positive for sleep disturbance (trouble falling asleep and wakes up earlier,cannot go back to sleep.). Negative for confusion. The patient is nervous/anxious.       No current outpatient prescriptions on file prior to visit.   No current facility-administered medications on file prior to visit.      Past Medical History:  Diagnosis Date  . Abnormal Pap smear   . Chicken pox   . Diabetes mellitus without complication (HCC)   . Frequent headaches   . Gestational diabetes   . Herpes    No Known Allergies  Family History  Problem Relation Age of Onset  . Hypertension Maternal Grandmother   . Hypertension Mother   . Hypertension Father   . Cancer Neg Hx     Social History   Social History  . Marital status: Single    Spouse name: N/A  . Number of children: N/A  . Years of education: N/A   Social History Main Topics  . Smoking status: Never Smoker  . Smokeless tobacco: Never Used  . Alcohol use No  . Drug use: No  . Sexual activity: Not Asked   Other Topics Concern  . None   Social History Narrative  . None    Vitals:   10/19/16 0752  BP: 124/80  Pulse: 78  Resp: 12  O2 sat at RA 95%  Body mass index is 38.81 kg/m.   Physical Exam  Nursing note and vitals reviewed. Constitutional: She is oriented to person, place, and time. She appears well-developed. No distress.  HENT:  Head: Atraumatic.  Mouth/Throat: Oropharynx is clear and moist and mucous membranes are normal.  Eyes: Conjunctivae and EOM are normal. Pupils are equal, round, and reactive to light.  Neck: No tracheal deviation present. Thyromegaly (mild.) present. No thyroid mass present.  Cardiovascular: Normal rate and regular rhythm.   No murmur heard. Pulses:       Dorsalis pedis pulses are 2+ on the right side, and 2+ on the left side.  Respiratory: Effort normal and breath sounds normal. No respiratory distress. She exhibits tenderness (costochondral joints,left).  GI: Soft. She exhibits no mass. There is no hepatomegaly. There is no tenderness.  Musculoskeletal: She exhibits no edema or tenderness.       Cervical back: She exhibits normal range of motion, no tenderness and no bony tenderness.       Thoracic back: She exhibits no tenderness and no bony tenderness.  Lymphadenopathy:    She has no cervical adenopathy.  Neurological: She is alert and oriented to person, place, and time. She has normal strength. Coordination and gait normal.  Skin: Skin is warm. No erythema.  Psychiatric: Her mood appears anxious.  Well groomed, good eye contact.      ASSESSMENT AND PLAN:   Shatera was seen today for establish care.  Diagnoses and all orders for this visit:  Chest pain, unspecified type  We discussed possible causes: musculoskeletal,GI,anxiety among some.It doe snot seem to be cardiac. We discussed the probability of this being cardiac related,given her CV rist factors the likelihood is low. We will hold on cardiac work-up for now. Clearly instructed about warning signs.  -     Basic metabolic panel  Gastroesophageal reflux disease, esophagitis presence not specified  Could be contributing/causing chest pain. GERD precautions discussed. She agrees with starting PPI treatment. F/U in 3 months.  -     omeprazole (PRILOSEC) 20 MG capsule; Take 1 capsule (20 mg total) by mouth daily.  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 Consistency with healthy diet and physical activity recommended. Daily brisk walking for 15-30 min as tolerated.  -     Basic metabolic panel -     TSH -     Lipid  panel  Migraine with aura and without status migrainosus, not intractable  Stable. OTC Excedrin Migraine or Ibuprofen recommended for now. No changes in current management. F/U in 6-12 months.  Hx of gestational diabetes mellitus, not currently pregnant  Healthy life style in general recommended for primary prevention.  -     Hemoglobin A1c -     Fructosamine  Lipid screening -     Lipid panel     Betty G. [place], MD  [facility]. [place] office.

## 2016-10-19 NOTE — Progress Notes (Signed)
Pre visit review using our clinic review tool, if applicable. No additional management support is needed unless otherwise documented below in the visit note. 

## 2016-10-21 LAB — FRUCTOSAMINE: FRUCTOSAMINE: 207 umol/L (ref 190–270)

## 2017-01-08 ENCOUNTER — Telehealth: Payer: Self-pay

## 2017-01-08 NOTE — Telephone Encounter (Signed)
Patient called to report momentary episode of lip tingling and right hand tingling. She denies any headache, facial drooping, speech slurring, extremity weakness or other neuro deficits. She was just wanting to know if she needs to be concerned. Advised pt that in absence of any other s/s and given her good medical history would be appropriate to watch and wait. Suggested pt take note if symptoms happen again and write down details. Also advised her to seek UC or call office for evaluation if symptoms persist. Pt voiced understanding to all. Nothing further needed at this time.

## 2017-01-18 ENCOUNTER — Ambulatory Visit: Payer: Managed Care, Other (non HMO) | Admitting: Family Medicine

## 2017-01-24 NOTE — Progress Notes (Signed)
HPI:   Zoe Randall is a 36 y.o. female, who is here today to follow on recent OV.   She was seen on 10/19/16, when she was c/o chest pain and heartburn.  GERD: Omeprazole 20 mg and GERD precautions recommended.  Chest pain and heartburn resolved. She is still eating certain food that aggravated symptoms before but with PPI she has no symptoms.  Denies upper abdominal pain, vomiting, changes in bowel habits, blood in stool or melena.   Obesity:  Dietary changes since her last OV: Increased green intake and less fried food. Exercise: None.   Lab Results  Component Value Date   HGBA1C 6.1 10/19/2016    She recently called reporting transient episode of lip and right hand tingling, 01/08/17. She has Hx of migraine, she did not have associated headache or focal weakness. She was at work and thinks it was related to stress, alleviated by deep and "controlled breathing", lasted about 2 min. She has not had any since then and she denies prior episodes.  Migraines: She has had more frequent migraines, 1-2 episodes per week. She has had migraine exacerbation with her menses usually.  For the past 2 months, visual aura preceding headache sometimes: 20-30 min before headache onset, "grary" vision.  Exacerbated by poor sleep. + Nausea and photophobia.  Ibuprofen or BC powder helped. A few years ago she used Imitrex nasal spray did not help with acute episode.  She has been on Topamax, which helped but according to pt she took high dose.  She thinks new lights in her office may be aggravating headaches, she would like a letter so she can have them remove. They were placed about 2-3 months ago.   She also mentions that has had some RLQ pain associated with vaginal spotting in between menses for the past couple months. She has an appt with her gyn today. LMP 01/11/17. S/P oophorectomy.   Review of Systems  Constitutional: Negative for activity change, appetite change,  fatigue and fever.  HENT: Negative for mouth sores, nosebleeds and trouble swallowing.   Eyes: Negative for pain, redness and visual disturbance.  Respiratory: Negative for shortness of breath and wheezing.   Cardiovascular: Negative for chest pain, palpitations and leg swelling.  Gastrointestinal: Negative for abdominal distention, anal bleeding and vomiting.       Negative for changes in bowel habits.  Endocrine: Negative for cold intolerance and heat intolerance.  Genitourinary: Positive for menstrual problem. Negative for decreased urine volume, difficulty urinating, dysuria, flank pain and hematuria.  Musculoskeletal: Negative for gait problem and neck pain.  Skin: Negative for pallor and rash.  Neurological: Positive for headaches. Negative for seizures, syncope and weakness.  Psychiatric/Behavioral: Negative for confusion and sleep disturbance.      Current Outpatient Prescriptions on File Prior to Visit  Medication Sig Dispense Refill  . omeprazole (PRILOSEC) 20 MG capsule Take 1 capsule (20 mg total) by mouth daily. 90 capsule 1   No current facility-administered medications on file prior to visit.      Past Medical History:  Diagnosis Date  . Abnormal Pap smear   . Chicken pox   . Diabetes mellitus without complication (HCC)   . Frequent headaches   . Gestational diabetes   . Herpes    No Known Allergies  Social History   Social History  . Marital status: Single    Spouse name: N/A  . Number of children: N/A  . Years of education: N/A  Social History Main Topics  . Smoking status: Never Smoker  . Smokeless tobacco: Never Used  . Alcohol use No  . Drug use: No  . Sexual activity: Not Asked   Other Topics Concern  . None   Social History Narrative  . None    Vitals:   01/25/17 0927  BP: 118/80  Pulse: 100  Resp: 12   Body mass index is 38.17 kg/m. O2 sat at RA 98% Wt Readings from Last 3 Encounters:  01/25/17 222 lb 6 oz (100.9 kg)    10/19/16 226 lb 2 oz (102.6 kg)  03/26/13 236 lb (107 kg)     Physical Exam  Nursing note and vitals reviewed. Constitutional: She is oriented to person, place, and time. She appears well-developed. No distress.  HENT:  Head: Atraumatic.  Mouth/Throat: Oropharynx is clear and moist and mucous membranes are normal.  Eyes: Conjunctivae and EOM are normal. Pupils are equal, round, and reactive to light.  Cardiovascular: Normal rate and regular rhythm.   No murmur heard. Pulses:      Dorsalis pedis pulses are 2+ on the right side, and 2+ on the left side.  Respiratory: Effort normal and breath sounds normal. No respiratory distress.  GI: Soft. She exhibits no mass. There is no hepatomegaly. There is no tenderness.  Musculoskeletal: She exhibits no edema.  Lymphadenopathy:    She has no cervical adenopathy.  Neurological: She is alert and oriented to person, place, and time. She has normal strength. No cranial nerve deficit. Coordination and gait normal.  Skin: Skin is warm. No erythema.  Psychiatric: She has a normal mood and affect.  Well groomed, good eye contact.     ASSESSMENT AND PLAN:   Ms. Zoe Randall was seen today for follow-up.  Diagnoses and all orders for this visit:  Migraine with aura and without status migrainosus, not intractable  She agrees with starting Topamax, half tablet for a week and increase to a whole tablet he is well tolerated. She never tried oral Imitrex, agrees with trying for acute migraine onset. We discussed side effects of both medications as well as adverse effects if pregnancy occurs, she voices understanding..  Letter for employer given to have fluorescent light remove. Follow-up in 4-6 weeks. Instructed about warning signs.   -     topiramate (TOPAMAX) 50 MG tablet; Take 1 tablet (50 mg total) by mouth at bedtime. -     SUMAtriptan (IMITREX) 50 MG tablet; Take 1 tablet (50 mg total) by mouth daily as needed for migraine. May repeat in 2  hours if headache persists or recurs.  Gastroesophageal reflux disease, esophagitis presence not specified  Improved. GERD precautions discussed. No changes on Omeprazole 20 mg. F/U in 6-12 months.  Class 2 obesity without serious comorbidity with body mass index (BMI) of 38.0 to 38.9 in adult, unspecified obesity type  She lost about 4 Lb since her last OV, 10/2016. Hx of pre diabetes, we discussed benefits of wt loss as well as adverse effects of obesity. Consistency with healthy diet and physical activity recommended. Daily brisk walking for 15-30 min as tolerated.   Abnormal vaginal bleeding  No further workup recommended at this time since she is seeing her gynecologist this afternoon.  Lab Results  Component Value Date   TSH 1.30 10/19/2016        Zoe Heffern G. [place], MD  [facility]. [place] office.

## 2017-01-25 ENCOUNTER — Encounter: Payer: Self-pay | Admitting: Family Medicine

## 2017-01-25 ENCOUNTER — Ambulatory Visit (INDEPENDENT_AMBULATORY_CARE_PROVIDER_SITE_OTHER): Payer: Managed Care, Other (non HMO) | Admitting: Family Medicine

## 2017-01-25 VITALS — BP 118/80 | HR 100 | Resp 12 | Ht 64.0 in | Wt 222.4 lb

## 2017-01-25 DIAGNOSIS — Z6838 Body mass index (BMI) 38.0-38.9, adult: Secondary | ICD-10-CM | POA: Diagnosis not present

## 2017-01-25 DIAGNOSIS — K219 Gastro-esophageal reflux disease without esophagitis: Secondary | ICD-10-CM

## 2017-01-25 DIAGNOSIS — N939 Abnormal uterine and vaginal bleeding, unspecified: Secondary | ICD-10-CM | POA: Diagnosis not present

## 2017-01-25 DIAGNOSIS — E669 Obesity, unspecified: Secondary | ICD-10-CM | POA: Diagnosis not present

## 2017-01-25 DIAGNOSIS — G43109 Migraine with aura, not intractable, without status migrainosus: Secondary | ICD-10-CM | POA: Diagnosis not present

## 2017-01-25 MED ORDER — SUMATRIPTAN SUCCINATE 50 MG PO TABS: 50.0000 mg | ORAL_TABLET | Freq: Every day | ORAL | 0 refills | Status: DC | PRN

## 2017-01-25 MED ORDER — TOPIRAMATE 50 MG PO TABS: 50.0000 mg | ORAL_TABLET | Freq: Every day | ORAL | 1 refills | Status: DC

## 2017-01-25 NOTE — Patient Instructions (Signed)
A few things to remember from today's visit:   Gastroesophageal reflux disease, esophagitis presence not specified  Class 2 obesity without serious comorbidity with body mass index (BMI) of 38.0 to 38.9 in adult, unspecified obesity type  Migraine with aura and without status migrainosus, not intractable - Plan: topiramate (TOPAMAX) 50 MG tablet, SUMAtriptan (IMITREX) 50 MG tablet  Start with 1/2 tab of Topamax. No changes in Omeprazole for now.   Please be sure medication list is accurate. If a new problem present, please set up appointment sooner than planned today.

## 2017-03-30 ENCOUNTER — Other Ambulatory Visit: Payer: Self-pay

## 2017-03-30 DIAGNOSIS — G43109 Migraine with aura, not intractable, without status migrainosus: Secondary | ICD-10-CM

## 2017-03-30 MED ORDER — TOPIRAMATE 50 MG PO TABS: 50.0000 mg | ORAL_TABLET | Freq: Every day | ORAL | 0 refills | Status: DC

## 2017-05-13 ENCOUNTER — Ambulatory Visit (INDEPENDENT_AMBULATORY_CARE_PROVIDER_SITE_OTHER): Payer: 59 | Admitting: Family Medicine

## 2017-05-13 VITALS — BP 140/96 | HR 84 | Temp 97.9°F | Wt 224.0 lb

## 2017-05-13 DIAGNOSIS — F419 Anxiety disorder, unspecified: Secondary | ICD-10-CM

## 2017-05-13 DIAGNOSIS — R079 Chest pain, unspecified: Secondary | ICD-10-CM

## 2017-05-13 DIAGNOSIS — R030 Elevated blood-pressure reading, without diagnosis of hypertension: Secondary | ICD-10-CM | POA: Diagnosis not present

## 2017-05-13 DIAGNOSIS — Z131 Encounter for screening for diabetes mellitus: Secondary | ICD-10-CM

## 2017-05-13 DIAGNOSIS — R002 Palpitations: Secondary | ICD-10-CM | POA: Diagnosis not present

## 2017-05-13 NOTE — Progress Notes (Signed)
Subjective:    Patient ID: Zoe Randall, female    DOB: Nov 18, 1980, 36 y.o.   MRN: [id number]  No chief complaint on file.   HPI Patient was seen today for acute concern.  Yesterday at work pt was in a mtg, felt hot, had palpitations, L sided pain/discomfort in L arm, Dizziness, and HA.  Pt did not have LOC.  States feeling may have lasted 1 hr.  Pt "feeling much better" now, but still endorses a nagging pain in chest.  Prior to the mtg pt had just eaten, but was rushed.  Pt had 1 cup of coffee.  She tries to drink 4 bottles of water per day.     In the past pt was told she was pre-diabetic.    Pt denies ever having HTN.  Pt's mom has HTN.  Pt does endorse possible anxiety surrounding new promotion at work and family.    Past Medical History:  Diagnosis Date  . Abnormal Pap smear   . Chicken pox   . Diabetes mellitus without complication (HCC)   . Frequent headaches   . Gestational diabetes   . Herpes     No Known Allergies  ROS General: Denies fever, chills, night sweats, changes in weight, changes in appetite HEENT: Denies ear pain, changes in vision, rhinorrhea, sore throat  +HA, dizziness CV: Denies SOB, orthopnea  +CP, L arm pain, palpitations Pulm: Denies SOB, cough, wheezing GI: Denies abdominal pain, nausea, vomiting, diarrhea, constipation GU: Denies dysuria, hematuria, frequency, vaginal discharge Msk: Denies muscle cramps, joint pains Neuro: Denies weakness, numbness, tingling Skin: Denies rashes, bruising Psych: Denies depression, anxiety, hallucinations     Objective:    Blood pressure (!) 140/96, pulse 84, temperature 97.9 F (36.6 C), weight 224 lb (101.6 kg).   Gen. Pleasant, well-nourished, in no distress, normal affect  HEENT: /AT, face symmetric, conjunctiva clear, no scleral icterus, PERRLA, EOMI, nares patent without drainage, pharynx without erythema or exudate. Neck: No JVD, no thyromegaly, no carotid bruits Lungs: no accessory muscle use,  CTAB, no wheezes or rales Cardiovascular: RRR, no m/r/g, no peripheral edema. Abdomen: BS present, soft, NT/ND, no hepatosplenomegaly. Musculoskeletal: No deformities, no cyanosis or clubbing, normal tone.  No TTP of chest wall. Neuro:  A&Ox3, CN II-XII intact, normal gait Skin:  Warm, no lesions/ rash   Wt Readings from Last 3 Encounters:  05/13/17 224 lb (101.6 kg)  01/25/17 222 lb 6 oz (100.9 kg)  10/19/16 226 lb 2 oz (102.6 kg)    Lab Results  Component Value Date   WBC 10.3 03/27/2013   HGB 10.1 (L) 03/27/2013   HCT 30.1 (L) 03/27/2013   PLT 162 03/27/2013   GLUCOSE 96 10/19/2016   CHOL 133 10/19/2016   TRIG 65.0 10/19/2016   HDL 53.50 10/19/2016   LDLCALC 66 10/19/2016   NA 140 10/19/2016   K 4.4 10/19/2016   CL 109 10/19/2016   CREATININE 0.74 10/19/2016   BUN 17 10/19/2016   CO2 24 10/19/2016   TSH 1.30 10/19/2016   HGBA1C 6.1 10/19/2016    Assessment/Plan:  Chest pain, unspecified type  - Plan: EKG 12-Lead -EKG reviewed and normal, V rate 74, no ST elevation or inverted T waves.  Palpitations - Plan: TSH, T4, free, Basic metabolic panel, CBC with Differential/Platelet  Screening for diabetes mellitus (DM)  - Plan: Hemoglobin A1c  Elevated blood pressure reading without diagnosis of hypertension -Initial BP reading 130/100, repeated times 2   132/100, 140/96 -Discussed lifestyle modifications. Patient  advised if BP remains high will likely need medication -Given handout. Encouraged to reduce stress -Also discussed obtaining home blood pressure monitor and keeping a log -Discussed follow-up in 2 weeks with PCP for recheck BP  Anxiety -GAD 7 score 8 which equals mild anxiety -Given handout on behavioral health for counseling -Given handout on ways to reduce stress and cope with anxiety.

## 2017-05-13 NOTE — Patient Instructions (Signed)
DASH Eating Plan DASH stands for "Dietary Approaches to Stop Hypertension." The DASH eating plan is a healthy eating plan that has been shown to reduce high blood pressure (hypertension). It may also reduce your risk for type 2 diabetes, heart disease, and stroke. The DASH eating plan may also help with weight loss. What are tips for following this plan? General guidelines  Avoid eating more than 2,300 mg (milligrams) of salt (sodium) a day. If you have hypertension, you may need to reduce your sodium intake to 1,500 mg a day.  Limit alcohol intake to no more than 1 drink a day for nonpregnant women and 2 drinks a day for men. One drink equals 12 oz of beer, 5 oz of wine, or 1 oz of hard liquor.  Work with your health care provider to maintain a healthy body weight or to lose weight. Ask what an ideal weight is for you.  Get at least 30 minutes of exercise that causes your heart to beat faster (aerobic exercise) most days of the week. Activities may include walking, swimming, or biking.  Work with your health care provider or diet and nutrition specialist (dietitian) to adjust your eating plan to your individual calorie needs. Reading food labels  Check food labels for the amount of sodium per serving. Choose foods with less than 5 percent of the Daily Value of sodium. Generally, foods with less than 300 mg of sodium per serving fit into this eating plan.  To find whole grains, look for the word "whole" as the first word in the ingredient list. Shopping  Buy products labeled as "low-sodium" or "no salt added."  Buy fresh foods. Avoid canned foods and premade or frozen meals. Cooking  Avoid adding salt when cooking. Use salt-free seasonings or herbs instead of table salt or sea salt. Check with your health care provider or pharmacist before using salt substitutes.  Do not fry foods. Cook foods using healthy methods such as baking, boiling, grilling, and broiling instead.  Cook with  heart-healthy oils, such as olive, canola, soybean, or sunflower oil. Meal planning   Eat a balanced diet that includes: ? 5 or more servings of fruits and vegetables each day. At each meal, try to fill half of your plate with fruits and vegetables. ? Up to 6-8 servings of whole grains each day. ? Less than 6 oz of lean meat, poultry, or fish each day. A 3-oz serving of meat is about the same size as a deck of cards. One egg equals 1 oz. ? 2 servings of low-fat dairy each day. ? A serving of nuts, seeds, or beans 5 times each week. ? Heart-healthy fats. Healthy fats called Omega-3 fatty acids are found in foods such as flaxseeds and coldwater fish, like sardines, salmon, and mackerel.  Limit how much you eat of the following: ? Canned or prepackaged foods. ? Food that is high in trans fat, such as fried foods. ? Food that is high in saturated fat, such as fatty meat. ? Sweets, desserts, sugary drinks, and other foods with added sugar. ? Full-fat dairy products.  Do not salt foods before eating.  Try to eat at least 2 vegetarian meals each week.  Eat more home-cooked food and less restaurant, buffet, and fast food.  When eating at a restaurant, ask that your food be prepared with less salt or no salt, if possible. What foods are recommended? The items listed may not be a complete list. Talk with your dietitian about what   dietary choices are best for you. Grains Whole-grain or whole-wheat bread. Whole-grain or whole-wheat pasta. Brown rice. Oatmeal. Quinoa. Bulgur. Whole-grain and low-sodium cereals. Pita bread. Low-fat, low-sodium crackers. Whole-wheat flour tortillas. Vegetables Fresh or frozen vegetables (raw, steamed, roasted, or grilled). Low-sodium or reduced-sodium tomato and vegetable juice. Low-sodium or reduced-sodium tomato sauce and tomato paste. Low-sodium or reduced-sodium canned vegetables. Fruits All fresh, dried, or frozen fruit. Canned fruit in natural juice (without  added sugar). Meat and other protein foods Skinless chicken or turkey. Ground chicken or turkey. Pork with fat trimmed off. Fish and seafood. Egg whites. Dried beans, peas, or lentils. Unsalted nuts, nut butters, and seeds. Unsalted canned beans. Lean cuts of beef with fat trimmed off. Low-sodium, lean deli meat. Dairy Low-fat (1%) or fat-free (skim) milk. Fat-free, low-fat, or reduced-fat cheeses. Nonfat, low-sodium ricotta or cottage cheese. Low-fat or nonfat yogurt. Low-fat, low-sodium cheese. Fats and oils Soft margarine without trans fats. Vegetable oil. Low-fat, reduced-fat, or light mayonnaise and salad dressings (reduced-sodium). Canola, safflower, olive, soybean, and sunflower oils. Avocado. Seasoning and other foods Herbs. Spices. Seasoning mixes without salt. Unsalted popcorn and pretzels. Fat-free sweets. What foods are not recommended? The items listed may not be a complete list. Talk with your dietitian about what dietary choices are best for you. Grains Baked goods made with fat, such as croissants, muffins, or some breads. Dry pasta or rice meal packs. Vegetables Creamed or fried vegetables. Vegetables in a cheese sauce. Regular canned vegetables (not low-sodium or reduced-sodium). Regular canned tomato sauce and paste (not low-sodium or reduced-sodium). Regular tomato and vegetable juice (not low-sodium or reduced-sodium). Pickles. Olives. Fruits Canned fruit in a light or heavy syrup. Fried fruit. Fruit in cream or butter sauce. Meat and other protein foods Fatty cuts of meat. Ribs. Fried meat. Bacon. Sausage. Bologna and other processed lunch meats. Salami. Fatback. Hotdogs. Bratwurst. Salted nuts and seeds. Canned beans with added salt. Canned or smoked fish. Whole eggs or egg yolks. Chicken or turkey with skin. Dairy Whole or 2% milk, cream, and half-and-half. Whole or full-fat cream cheese. Whole-fat or sweetened yogurt. Full-fat cheese. Nondairy creamers. Whipped toppings.  Processed cheese and cheese spreads. Fats and oils Butter. Stick margarine. Lard. Shortening. Ghee. Bacon fat. Tropical oils, such as coconut, palm kernel, or palm oil. Seasoning and other foods Salted popcorn and pretzels. Onion salt, garlic salt, seasoned salt, table salt, and sea salt. Worcestershire sauce. Tartar sauce. Barbecue sauce. Teriyaki sauce. Soy sauce, including reduced-sodium. Steak sauce. Canned and packaged gravies. Fish sauce. Oyster sauce. Cocktail sauce. Horseradish that you find on the shelf. Ketchup. Mustard. Meat flavorings and tenderizers. Bouillon cubes. Hot sauce and Tabasco sauce. Premade or packaged marinades. Premade or packaged taco seasonings. Relishes. Regular salad dressings. Where to find more information:  National Heart, Lung, and Blood Institute: www.nhlbi.nih.gov  American Heart Association: www.heart.org Summary  The DASH eating plan is a healthy eating plan that has been shown to reduce high blood pressure (hypertension). It may also reduce your risk for type 2 diabetes, heart disease, and stroke.  With the DASH eating plan, you should limit salt (sodium) intake to 2,300 mg a day. If you have hypertension, you may need to reduce your sodium intake to 1,500 mg a day.  When on the DASH eating plan, aim to eat more fresh fruits and vegetables, whole grains, lean proteins, low-fat dairy, and heart-healthy fats.  Work with your health care provider or diet and nutrition specialist (dietitian) to adjust your eating plan to your individual   calorie needs. This information is not intended to replace advice given to you by your health care provider. Make sure you discuss any questions you have with your health care provider. Document Released: 06/25/2011 Document Revised: 06/29/2016 Document Reviewed: 06/29/2016 Elsevier Interactive Patient Education  2017 [employer] Inc.  Generalized Anxiety Disorder, Adult Generalized anxiety disorder (GAD) is a mental health  disorder. People with this condition constantly worry about everyday events. Unlike normal anxiety, worry related to GAD is not triggered by a specific event. These worries also do not fade or get better with time. GAD interferes with life functions, including relationships, work, and school. GAD can vary from mild to severe. People with severe GAD can have intense waves of anxiety with physical symptoms (panic attacks). What are the causes? The exact cause of GAD is not known. What increases the risk? This condition is more likely to develop in:  Women.  People who have a family history of anxiety disorders.  People who are very shy.  People who experience very stressful life events, such as the death of a loved one.  People who have a very stressful family environment.  What are the signs or symptoms? People with GAD often worry excessively about many things in their lives, such as their health and family. They may also be overly concerned about:  Doing well at work.  Being on time.  Natural disasters.  Friendships.  Physical symptoms of GAD include:  Fatigue.  Muscle tension or having muscle twitches.  Trembling or feeling shaky.  Being easily startled.  Feeling like your heart is pounding or racing.  Feeling out of breath or like you cannot take a deep breath.  Having trouble falling asleep or staying asleep.  Sweating.  Nausea, diarrhea, or irritable bowel syndrome (IBS).  Headaches.  Trouble concentrating or remembering facts.  Restlessness.  Irritability.  How is this diagnosed? Your health care provider can diagnose GAD based on your symptoms and medical history. You will also have a physical exam. The health care provider will ask specific questions about your symptoms, including how severe they are, when they started, and if they come and go. Your health care provider may ask you about your use of alcohol or drugs, including prescription medicines.  Your health care provider may refer you to a mental health specialist for further evaluation. Your health care provider will do a thorough examination and may perform additional tests to rule out other possible causes of your symptoms. To be diagnosed with GAD, a person must have anxiety that:  Is out of his or her control.  Affects several different aspects of his or her life, such as work and relationships.  Causes distress that makes him or her unable to take part in normal activities.  Includes at least three physical symptoms of GAD, such as restlessness, fatigue, trouble concentrating, irritability, muscle tension, or sleep problems.  Before your health care provider can confirm a diagnosis of GAD, these symptoms must be present more days than they are not, and they must last for six months or longer. How is this treated? The following therapies are usually used to treat GAD:  Medicine. Antidepressant medicine is usually prescribed for long-term daily control. Antianxiety medicines may be added in severe cases, especially when panic attacks occur.  Talk therapy (psychotherapy). Certain types of talk therapy can be helpful in treating GAD by providing support, education, and guidance. Options include: ? Cognitive behavioral therapy (CBT). People learn coping skills and techniques to ease  their anxiety. They learn to identify unrealistic or negative thoughts and behaviors and to replace them with positive ones. ? Acceptance and commitment therapy (ACT). This treatment teaches people how to be mindful as a way to cope with unwanted thoughts and feelings. ? Biofeedback. This process trains you to manage your body's response (physiological response) through breathing techniques and relaxation methods. You will work with a therapist while machines are used to monitor your physical symptoms.  Stress management techniques. These include yoga, meditation, and exercise.  A mental health  specialist can help determine which treatment is best for you. Some people see improvement with one type of therapy. However, other people require a combination of therapies. Follow these instructions at home:  Take over-the-counter and prescription medicines only as told by your health care provider.  Try to maintain a normal routine.  Try to anticipate stressful situations and allow extra time to manage them.  Practice any stress management or self-calming techniques as taught by your health care provider.  Do not punish yourself for setbacks or for not making progress.  Try to recognize your accomplishments, even if they are small.  Keep all follow-up visits as told by your health care provider. This is important. Contact a health care provider if:  Your symptoms do not get better.  Your symptoms get worse.  You have signs of depression, such as: ? A persistently sad, cranky, or irritable mood. ? Loss of enjoyment in activities that used to bring you joy. ? Change in weight or eating. ? Changes in sleeping habits. ? Avoiding friends or family members. ? Loss of energy for normal tasks. ? Feelings of guilt or worthlessness. Get help right away if:  You have serious thoughts about hurting yourself or others. If you ever feel like you may hurt yourself or others, or have thoughts about taking your own life, get help right away. You can go to your nearest emergency department or call:  Your local emergency services (911 in the [place]  A suicide crisis helpline, such as the National Suicide Prevention Lifeline at 504-644-2419. This is open 24 hours a day.  Summary  Generalized anxiety disorder (GAD) is a mental health disorder that involves worry that is not triggered by a specific event.  People with GAD often worry excessively about many things in their lives, such as their health and family.  GAD may cause physical symptoms such as restlessness, trouble concentrating,  sleep problems, frequent sweating, nausea, diarrhea, headaches, and trembling or muscle twitching.  A mental health specialist can help determine which treatment is best for you. Some people see improvement with one type of therapy. However, other people require a combination of therapies. This information is not intended to replace advice given to you by your health care provider. Make sure you discuss any questions you have with your health care provider. Document Released: 10/31/2012 Document Revised: 05/26/2016 Document Reviewed: 05/26/2016 Elsevier Interactive Patient Education  2018 [employer].  Living With Anxiety After being diagnosed with an anxiety disorder, you may be relieved to know why you have felt or behaved a certain way. It is natural to also feel overwhelmed about the treatment ahead and what it will mean for your life. With care and support, you can manage this condition and recover from it. How to cope with anxiety Dealing with stress Stress is your body's reaction to life changes and events, both good and bad. Stress can last just a few hours or it can be ongoing.  Stress can play a major role in anxiety, so it is important to learn both how to cope with stress and how to think about it differently. Talk with your health care provider or a counselor to learn more about stress reduction. He or she may suggest some stress reduction techniques, such as:  Music therapy. This can include creating or listening to music that you enjoy and that inspires you.  Mindfulness-based meditation. This involves being aware of your normal breaths, rather than trying to control your breathing. It can be done while sitting or walking.  Centering prayer. This is a kind of meditation that involves focusing on a word, phrase, or sacred image that is meaningful to you and that brings you peace.  Deep breathing. To do this, expand your stomach and inhale slowly through your nose. Hold your breath  for 3-5 seconds. Then exhale slowly, allowing your stomach muscles to relax.  Self-talk. This is a skill where you identify thought patterns that lead to anxiety reactions and correct those thoughts.  Muscle relaxation. This involves tensing muscles then relaxing them.  Choose a stress reduction technique that fits your lifestyle and personality. Stress reduction techniques take time and practice. Set aside 5-15 minutes a day to do them. Therapists can offer training in these techniques. The training may be covered by some insurance plans. Other things you can do to manage stress include:  Keeping a stress diary. This can help you learn what triggers your stress and ways to control your response.  Thinking about how you respond to certain situations. You may not be able to control everything, but you can control your reaction.  Making time for activities that help you relax, and not feeling guilty about spending your time in this way.  Therapy combined with coping and stress-reduction skills provides the best chance for successful treatment. Medicines Medicines can help ease symptoms. Medicines for anxiety include:  Anti-anxiety drugs.  Antidepressants.  Beta-blockers.  Medicines may be used as the main treatment for anxiety disorder, along with therapy, or if other treatments are not working. Medicines should be prescribed by a health care provider. Relationships Relationships can play a big part in helping you recover. Try to spend more time connecting with trusted friends and family members. Consider going to couples counseling, taking family education classes, or going to family therapy. Therapy can help you and others better understand the condition. How to recognize changes in your condition Everyone has a different response to treatment for anxiety. Recovery from anxiety happens when symptoms decrease and stop interfering with your daily activities at home or work. This may mean  that you will start to:  Have better concentration and focus.  Sleep better.  Be less irritable.  Have more energy.  Have improved memory.  It is important to recognize when your condition is getting worse. Contact your health care provider if your symptoms interfere with home or work and you do not feel like your condition is improving. Where to find help and support: You can get help and support from these sources:  Self-help groups.  Online and [employer].  A trusted spiritual leader.  Couples counseling.  Family education classes.  Family therapy.  Follow these instructions at home:  Eat a healthy diet that includes plenty of vegetables, fruits, whole grains, low-fat dairy products, and lean protein. Do not eat a lot of foods that are high in solid fats, added sugars, or salt.  Exercise. Most adults should do the following: ?  Exercise for at least 150 minutes each week. The exercise should increase your heart rate and make you sweat (moderate-intensity exercise). ? Strengthening exercises at least twice a week.  Cut down on caffeine, tobacco, alcohol, and other potentially harmful substances.  Get the right amount and quality of sleep. Most adults need 7-9 hours of sleep each night.  Make choices that simplify your life.  Take over-the-counter and prescription medicines only as told by your health care provider.  Avoid caffeine, alcohol, and certain over-the-counter cold medicines. These may make you feel worse. Ask your pharmacist which medicines to avoid.  Keep all follow-up visits as told by your health care provider. This is important. Questions to ask your health care provider  Would I benefit from therapy?  How often should I follow up with a health care provider?  How long do I need to take medicine?  Are there any long-term side effects of my medicine?  Are there any alternatives to taking medicine? Contact a health care provider  if:  You have a hard time staying focused or finishing daily tasks.  You spend many hours a day feeling worried about everyday life.  You become exhausted by worry.  You start to have headaches, feel tense, or have nausea.  You urinate more than normal.  You have diarrhea. Get help right away if:  You have a racing heart and shortness of breath.  You have thoughts of hurting yourself or others. If you ever feel like you may hurt yourself or others, or have thoughts about taking your own life, get help right away. You can go to your nearest emergency department or call:  Your local emergency services (911 in the [place]  A suicide crisis helpline, such as the National Suicide Prevention Lifeline at (224)176-22351-780-540-3345. This is open 24-hours a day.  Summary  Taking steps to deal with stress can help calm you.  Medicines cannot cure anxiety disorders, but they can help ease symptoms.  Family, friends, and partners can play a big part in helping you recover from an anxiety disorder. This information is not intended to replace advice given to you by your health care provider. Make sure you discuss any questions you have with your health care provider. Document Released: 06/30/2016 Document Revised: 06/30/2016 Document Reviewed: 06/30/2016 Elsevier Interactive Patient Education  [employer]2018 Elsevier Inc.

## 2017-05-14 LAB — CBC WITH DIFFERENTIAL/PLATELET
BASOS ABS: 0.1 10*3/uL (ref 0.0–0.1)
Basophils Relative: 0.6 % (ref 0.0–3.0)
EOS PCT: 0.5 % (ref 0.0–5.0)
Eosinophils Absolute: 0 10*3/uL (ref 0.0–0.7)
HCT: 30.9 % — ABNORMAL LOW (ref 36.0–46.0)
HEMOGLOBIN: 9.9 g/dL — AB (ref 12.0–15.0)
LYMPHS PCT: 26.4 % (ref 12.0–46.0)
Lymphs Abs: 2.1 10*3/uL (ref 0.7–4.0)
MCHC: 32 g/dL (ref 30.0–36.0)
MCV: 73.3 fl — ABNORMAL LOW (ref 78.0–100.0)
MONO ABS: 0.9 10*3/uL (ref 0.1–1.0)
MONOS PCT: 11.2 % (ref 3.0–12.0)
Neutro Abs: 4.9 10*3/uL (ref 1.4–7.7)
Neutrophils Relative %: 61.3 % (ref 43.0–77.0)
Platelets: 311 10*3/uL (ref 150.0–400.0)
RBC: 4.22 Mil/uL (ref 3.87–5.11)
RDW: 17.7 % — ABNORMAL HIGH (ref 11.5–15.5)
WBC: 8.1 10*3/uL (ref 4.0–10.5)

## 2017-05-14 LAB — BASIC METABOLIC PANEL
BUN: 12 mg/dL (ref 6–23)
CO2: 25 mEq/L (ref 19–32)
Calcium: 9.2 mg/dL (ref 8.4–10.5)
Chloride: 104 mEq/L (ref 96–112)
Creatinine, Ser: 0.77 mg/dL (ref 0.40–1.20)
GFR: 109.66 mL/min (ref >60.00)
GLUCOSE: 89 mg/dL (ref 70–99)
POTASSIUM: 4 meq/L (ref 3.5–5.1)
SODIUM: 138 meq/L (ref 135–145)

## 2017-05-14 LAB — TSH: TSH: 1.01 u[IU]/mL (ref 0.35–4.50)

## 2017-05-14 LAB — T4, FREE: FREE T4: 0.9 ng/dL (ref 0.60–1.60)

## 2017-05-14 LAB — HEMOGLOBIN A1C: HEMOGLOBIN A1C: 6 % (ref 4.6–6.5)

## 2017-05-17 ENCOUNTER — Telehealth: Payer: Self-pay | Admitting: Family Medicine

## 2017-05-17 NOTE — Telephone Encounter (Signed)
She was seen on 01/25/17, when Topamax was started. She was supposed to follow 4-6 weeks after that visit. She needs a follow up appt. Thanks, BJ

## 2017-05-17 NOTE — Telephone Encounter (Signed)
Patient is calling to see if labs have been resulted.

## 2017-05-17 NOTE — Telephone Encounter (Signed)
° ° ° °  Pt saw Dr Salomon FickBanks and would like a call back about her lab results

## 2017-05-17 NOTE — Telephone Encounter (Signed)
° ° ° °  Pt call to ask if the below med can be increased to 2 times a day   topiramate (TOPAMAX) 50 MG tablet

## 2017-05-17 NOTE — Telephone Encounter (Signed)
Patient needs appointment per Dr. Swazil[place].

## 2017-05-18 NOTE — Telephone Encounter (Signed)
Left a VM for patient to give the office regarding lab results.

## 2017-05-18 NOTE — Telephone Encounter (Signed)
Left message on voice mail  to call back

## 2017-05-18 NOTE — Telephone Encounter (Signed)
Responded to this earlier.  Labs nml, hgb low.

## 2017-05-21 ENCOUNTER — Encounter: Payer: Self-pay | Admitting: Emergency Medicine

## 2017-05-21 NOTE — Telephone Encounter (Signed)
° ° ° ° ° °  LMOVM for pt to call and schedule an appt

## 2017-08-13 ENCOUNTER — Other Ambulatory Visit: Payer: Self-pay | Admitting: Family Medicine

## 2017-08-13 DIAGNOSIS — G43109 Migraine with aura, not intractable, without status migrainosus: Secondary | ICD-10-CM

## 2017-08-17 NOTE — Telephone Encounter (Signed)
Left voicemail for patient to return phone call to Schedule an appointment.

## 2017-08-19 NOTE — Telephone Encounter (Signed)
Left message to call clinic to schedule an appointment.

## 2017-08-20 NOTE — Telephone Encounter (Signed)
Called number on file, spoke with someone, she said I had the wrong number when I asked to speak with Zoe Randall.

## 2017-08-25 ENCOUNTER — Other Ambulatory Visit: Payer: Self-pay | Admitting: *Deleted

## 2017-08-25 DIAGNOSIS — G43109 Migraine with aura, not intractable, without status migrainosus: Secondary | ICD-10-CM

## 2017-08-25 MED ORDER — TOPIRAMATE 50 MG PO TABS: 50.0000 mg | ORAL_TABLET | Freq: Every day | ORAL | 0 refills | Status: DC

## 2017-11-19 ENCOUNTER — Other Ambulatory Visit: Payer: Self-pay | Admitting: Family Medicine

## 2017-11-19 DIAGNOSIS — G43109 Migraine with aura, not intractable, without status migrainosus: Secondary | ICD-10-CM

## 2018-05-04 ENCOUNTER — Encounter: Payer: Self-pay | Admitting: Family Medicine

## 2018-05-04 ENCOUNTER — Ambulatory Visit (INDEPENDENT_AMBULATORY_CARE_PROVIDER_SITE_OTHER): Payer: 59 | Admitting: Family Medicine

## 2018-05-04 VITALS — BP 120/82 | HR 83 | Temp 98.5°F | Resp 12 | Ht 64.0 in | Wt 222.5 lb

## 2018-05-04 DIAGNOSIS — G43109 Migraine with aura, not intractable, without status migrainosus: Secondary | ICD-10-CM

## 2018-05-04 DIAGNOSIS — Z0000 Encounter for general adult medical examination without abnormal findings: Secondary | ICD-10-CM

## 2018-05-04 DIAGNOSIS — Z131 Encounter for screening for diabetes mellitus: Secondary | ICD-10-CM

## 2018-05-04 DIAGNOSIS — N938 Other specified abnormal uterine and vaginal bleeding: Secondary | ICD-10-CM

## 2018-05-04 DIAGNOSIS — D509 Iron deficiency anemia, unspecified: Secondary | ICD-10-CM | POA: Insufficient documentation

## 2018-05-04 LAB — CBC
HCT: 29.5 % — ABNORMAL LOW (ref 36.0–46.0)
HEMOGLOBIN: 9.2 g/dL — AB (ref 12.0–15.0)
MCHC: 31.2 g/dL (ref 30.0–36.0)
PLATELETS: 252 10*3/uL (ref 150.0–400.0)
RBC: 4.38 Mil/uL (ref 3.87–5.11)
RDW: 19.1 % — ABNORMAL HIGH (ref 11.5–15.5)
WBC: 6.4 10*3/uL (ref 4.0–10.5)

## 2018-05-04 LAB — BASIC METABOLIC PANEL
BUN: 16 mg/dL (ref 6–23)
CHLORIDE: 108 meq/L (ref 96–112)
CO2: 24 meq/L (ref 19–32)
CREATININE: 0.78 mg/dL (ref 0.40–1.20)
Calcium: 8.7 mg/dL (ref 8.4–10.5)
GFR: 107.44 mL/min (ref >60.00)
GLUCOSE: 95 mg/dL (ref 70–99)
POTASSIUM: 4.4 meq/L (ref 3.5–5.1)
Sodium: 138 mEq/L (ref 135–145)

## 2018-05-04 LAB — HEMOGLOBIN A1C: Hgb A1c MFr Bld: 5.9 % (ref 4.6–6.5)

## 2018-05-04 LAB — TSH: TSH: 1.1 u[IU]/mL (ref 0.35–4.50)

## 2018-05-04 MED ORDER — SUMATRIPTAN SUCCINATE 50 MG PO TABS: 50.0000 mg | ORAL_TABLET | Freq: Every day | ORAL | 2 refills | Status: DC | PRN

## 2018-05-04 MED ORDER — IBUPROFEN 600 MG PO TABS: ORAL_TABLET | ORAL | 1 refills | Status: DC

## 2018-05-04 MED ORDER — TOPIRAMATE 50 MG PO TABS: 100.0000 mg | ORAL_TABLET | Freq: Every day | ORAL | 0 refills | Status: DC

## 2018-05-04 NOTE — Patient Instructions (Signed)
A few things to remember from today's visit:   Routine general medical examination at a health care facility  Iron deficiency anemia, unspecified iron deficiency anemia type - Plan: CBC, TSH  Diabetes mellitus screening - Plan: Hemoglobin A1c, Basic metabolic panel  Screening for lipid disorders  Migraine with aura and without status migrainosus, not intractable - Plan: topiramate (TOPAMAX) 50 MG tablet, SUMAtriptan (IMITREX) 50 MG tablet  Today you have you routine preventive visit.  At least 150 minutes of moderate exercise per week, daily brisk walking for 15-30 min is a good exercise option. Healthy diet low in saturated (animal) fats and sweets and consisting of fresh fruits and vegetables, lean meats such as fish and white chicken and whole grains.  These are some of recommendations for screening depending of age and risk factors:   - Vaccines:  Tdap vaccine every 10 years.  Shingles vaccine recommended at age 86, could be given after 36 years of age but not sure about insurance coverage.   Pneumonia vaccines:  Prevnar 13 at 65 and Pneumovax at 66. Sometimes Pneumovax is giving earlier if history of smoking, lung disease,diabetes,kidney disease among some.    Screening for diabetes annually.  Cervical cancer prevention:  Pap smear starts at 36 years of age and continues periodically until 36 years old in low risk women. Pap smear every 3 years between 48 and 41 years old. Pap smear every 3-5 years between women 30 and older if pap smear negative and HPV screening negative.   -Breast cancer: Mammogram: There is disagreement between experts about when to start screening in low risk asymptomatic female but recent recommendations are to start screening at 41 and not later than 36 years old , every 1-2 years and after 36 yo q 2 years. Screening is recommended until 36 years old but some women can continue screening depending of healthy issues.   Colon cancer screening:  starts at 36 years old until 36 years old.  Cholesterol disorder screening at age 7 and every 3 years.  Also recommended:  1. Dental visit- Brush and floss your teeth twice daily; visit your dentist twice a year. 2. Eye doctor- Get an eye exam at least every 2 years. 3. Helmet use- Always wear a helmet when riding a bicycle, motorcycle, rollerblading or skateboarding. 4. Safe sex- If you may be exposed to sexually transmitted infections, use a condom. 5. Seat belts- Seat belts can save your live; always wear one. 6. Smoke/Carbon Monoxide detectors- These detectors need to be installed on the appropriate level of your home. Replace batteries at least once a year. 7. Skin cancer- When out in the sun please cover up and use sunscreen 15 SPF or higher. 8. Violence- If anyone is threatening or hurting you, please tell your healthcare provider.  9. Drink alcohol in moderation- Limit alcohol intake to one drink or less per day. Never drink and drive.  Please be sure medication list is accurate. If a new problem present, please set up appointment sooner than planned today.

## 2018-05-04 NOTE — Progress Notes (Signed)
HPI:   Zoe Randall is a 37 y.o. female, who is here today for her routine physical.  Last CPE: 10/2016.  Regular exercise 3 or more time per week: No Following a healthy diet: Not consistently. She lives with her husband and 3 children  Chronic medical problems: Migraine headache,anemia, and GERD.  Pap smear 01/2017.She follows with gyn.   Immunization History  Administered Date(s) Administered  . Tdap 02/27/2013    She has no concerns today.  Iron deficiency anemia: She is not on iron supplementation. + Heavy bleeding with pelvic cramps.   Lab Results  Component Value Date   WBC 8.1 05/13/2017   HGB 9.9 (L) 05/13/2017   HCT 30.9 (L) 05/13/2017   MCV 73.3 (L) 05/13/2017   PLT 311.0 05/13/2017   S/P BTL.  She was on OCP in the past but exacerbated migraine.  C/O headaches, hx of migraines. It is worse around her menstrual periods,3 days in a week.  Bitemporal, severe sharp pain. Associated photophobia and nausea. Imitrex helps,she ran out of med.  Sleeps about 5 hours. + Fatigue. No pica.    Review of Systems  Constitutional: Positive for fatigue. Negative for appetite change and fever.  HENT: Negative for dental problem, hearing loss, mouth sores, sore throat, trouble swallowing and voice change.   Eyes: Negative for redness and visual disturbance.  Respiratory: Negative for cough, shortness of breath and wheezing.   Cardiovascular: Negative for chest pain, palpitations and leg swelling.  Gastrointestinal: Negative for abdominal pain, nausea and vomiting.       No changes in bowel habits.  Endocrine: Negative for cold intolerance, heat intolerance, polydipsia, polyphagia and polyuria.  Genitourinary: Positive for pelvic pain. Negative for decreased urine volume, dysuria, hematuria, vaginal bleeding and vaginal discharge.  Musculoskeletal: Negative for arthralgias, gait problem and myalgias.  Skin: Negative for color change and rash.    Allergic/Immunologic: Negative for environmental allergies.  Neurological: Positive for headaches. Negative for syncope and weakness.  Hematological: Negative for adenopathy. Does not bruise/bleed easily.  Psychiatric/Behavioral: Negative for confusion and sleep disturbance. The patient is not nervous/anxious.   All other systems reviewed and are negative.     No current outpatient medications on file prior to visit.   No current facility-administered medications on file prior to visit.      Past Medical History:  Diagnosis Date  . Abnormal Pap smear   . Chicken pox   . Diabetes mellitus without complication (HCC)   . Frequent headaches   . Gestational diabetes   . Herpes     Past Surgical History:  Procedure Laterality Date  . COLPOSCOPY    . SALPINGOOPHORECTOMY    . TUBAL LIGATION Bilateral 03/28/2013   Procedure: POST PARTUM TUBAL LIGATION;  Surgeon: Geryl Rankins, MD;  Location: [facility];  Service: Gynecology;  Laterality: Bilateral;    No Known Allergies  Family History  Problem Relation Age of Onset  . Hypertension Maternal Grandmother   . Hypertension Mother   . Hypertension Father   . Cancer Neg Hx     Social History   Socioeconomic History  . Marital status: Single    Spouse name: Not on file  . Number of children: Not on file  . Years of education: Not on file  . Highest education level: Not on file  Occupational History  . Not on file  Social Needs  . Financial resource strain: Not on file  . Food insecurity:    Worry:  Not on file    Inability: Not on file  . Transportation needs:    Medical: Not on file    Non-medical: Not on file  Tobacco Use  . Smoking status: Never Smoker  . Smokeless tobacco: Never Used  Substance and Sexual Activity  . Alcohol use: No  . Drug use: No  . Sexual activity: Not on file  Lifestyle  . Physical activity:    Days per week: Not on file    Minutes per session: Not on file  . Stress: Not on file   Relationships  . Social connections:    Talks on phone: Not on file    Gets together: Not on file    Attends religious service: Not on file    Active member of club or organization: Not on file    Attends meetings of clubs or organizations: Not on file    Relationship status: Not on file  Other Topics Concern  . Not on file  Social History Narrative  . Not on file     Vitals:   05/04/18 0803  BP: 120/82  Pulse: 83  Resp: 12  Temp: 98.5 F (36.9 C)  SpO2: 99%   Body mass index is 38.19 kg/m.   Wt Readings from Last 3 Encounters:  05/04/18 222 lb 8 oz (100.9 kg)  05/13/17 224 lb (101.6 kg)  01/25/17 222 lb 6 oz (100.9 kg)    Physical Exam  Nursing note and vitals reviewed. Constitutional: She is oriented to person, place, and time. She appears well-developed. No distress.  HENT:  Head: Normocephalic and atraumatic.  Right Ear: Hearing, tympanic membrane, external ear and ear canal normal.  Left Ear: Hearing, tympanic membrane, external ear and ear canal normal.  Mouth/Throat: Uvula is midline, oropharynx is clear and moist and mucous membranes are normal.  Eyes: Pupils are equal, round, and reactive to light. Conjunctivae and EOM are normal.  Neck: No tracheal deviation present. Thyromegaly (mild,no masses) present.  Cardiovascular: Normal rate and regular rhythm.  No murmur heard. Pulses:      Dorsalis pedis pulses are 2+ on the right side, and 2+ on the left side.       Posterior tibial pulses are 2+ on the right side, and 2+ on the left side.  Respiratory: Effort normal and breath sounds normal. No respiratory distress.  GI: Soft. She exhibits no mass. There is no hepatomegaly. There is no tenderness.  Genitourinary:  Genitourinary Comments: Deferred to gyn.  Musculoskeletal: She exhibits no edema.  No major deformity or signs of synovitis appreciated.  Lymphadenopathy:    She has no cervical adenopathy.       Right: No supraclavicular adenopathy present.        Left: No supraclavicular adenopathy present.  Neurological: She is alert and oriented to person, place, and time. She has normal strength. No cranial nerve deficit. Coordination and gait normal.  Reflex Scores:      Bicep reflexes are 2+ on the right side and 2+ on the left side.      Patellar reflexes are 2+ on the right side and 2+ on the left side. Skin: Skin is warm. No rash noted. No erythema.  Psychiatric: She has a normal mood and affect. Her speech is normal.  Well groomed, good eye contact.      ASSESSMENT AND PLAN:  Zoe Randall was here today annual physical examination.   Orders Placed This Encounter  Procedures  . CBC  . TSH  .  Hemoglobin A1c  . Basic metabolic panel   Lab Results  Component Value Date   HGBA1C 5.9 05/04/2018   Lab Results  Component Value Date   WBC 6.4 05/04/2018   HGB 9.2 (L) 05/04/2018   HCT 29.5 (L) 05/04/2018   MCV 67.2 Repeated and verified X2. (L) 05/04/2018   PLT 252.0 05/04/2018   Lab Results  Component Value Date   CREATININE 0.78 05/04/2018   BUN 16 05/04/2018   NA 138 05/04/2018   K 4.4 05/04/2018   CL 108 05/04/2018   CO2 24 05/04/2018   Lab Results  Component Value Date   TSH 1.10 05/04/2018    Routine general medical examination at a health care facility  We discussed the importance of regular physical activity and healthy diet for prevention of chronic illness and/or complications. Preventive guidelines reviewed. Vaccination up to date. She will continue her female preventive care with her gyn. Next CPE in a year.  Iron deficiency anemia, unspecified iron deficiency anemia type  Related to heavy menses. Further recommendations will be given according to CBC results.  -     CBC -     TSH  Diabetes mellitus screening -     Hemoglobin A1c -     Basic metabolic panel  Migraine with aura and without status migrainosus, not intractable  After discussion of some options,she would like to  increase dose of Topamax from 50 mg to 75 mg and then 100 mg as tolerated. Side effects discussed. Instructed about warning signs. Headache diary.  -     topiramate (TOPAMAX) 50 MG tablet; Take 2 tablets (100 mg total) by mouth at bedtime. -     SUMAtriptan (IMITREX) 50 MG tablet; Take 1 tablet (50 mg total) by mouth daily as needed for migraine. May repeat in 2 hours if headache persists or recurs.  DUB (dysfunctional uterine bleeding)  It is causing anemia. So treatment options discussed. She is not interested in IUD or Depo Provera. OCP's aggravated headache. Ibuprofen side effects discussed, she will try taking it for 2-3 days with menses.  -     ibuprofen (ADVIL,MOTRIN) 600 MG tablet; 1 tablet every 8 hours with food a day before your menstrual periods and for 2-3 days.     Return in 3 months (on 08/04/2018) for HA,wt.     Zoe Ambrosino G. [place], MD  [facility]. [place] [facility].

## 2018-05-07 MED ORDER — FERROUS SULFATE 325 (65 FE) MG PO TABS: 325.0000 mg | ORAL_TABLET | Freq: Every day | ORAL | 3 refills | Status: DC

## 2018-05-10 ENCOUNTER — Encounter: Payer: Self-pay | Admitting: *Deleted

## 2018-06-06 ENCOUNTER — Other Ambulatory Visit: Payer: Self-pay | Admitting: Family Medicine

## 2018-06-06 DIAGNOSIS — G43109 Migraine with aura, not intractable, without status migrainosus: Secondary | ICD-10-CM

## 2018-10-13 ENCOUNTER — Telehealth: Payer: Self-pay | Admitting: Family Medicine

## 2018-10-13 DIAGNOSIS — G43109 Migraine with aura, not intractable, without status migrainosus: Secondary | ICD-10-CM

## 2018-10-13 NOTE — Telephone Encounter (Signed)
Patient states insurance requires 90 day supply.

## 2018-10-13 NOTE — Telephone Encounter (Signed)
Requested medication (s) are due for refill today: Yes  Requested medication (s) are on the active medication list: Yes  Last refill:  05/2018  Future visit scheduled: No  Notes to clinic:  Unable to refill, cannot delegate     Requested Prescriptions  Pending Prescriptions Disp Refills   topiramate (TOPAMAX) 50 MG tablet 180 tablet 2    Sig: Take 2 tablets (100 mg total) by mouth daily.     Not Delegated - Neurology: Anticonvulsants - topiramate & zonisamide Failed - 10/13/2018 12:06 PM      Failed - This refill cannot be delegated      Passed - Cr in normal range and within 360 days    Creatinine, Ser  Date Value Ref Range Status  05/04/2018 0.78 0.40 - 1.20 mg/dL Final         Passed - CO2 in normal range and within 360 days    CO2  Date Value Ref Range Status  05/04/2018 24 19 - 32 mEq/L Final         Passed - Valid encounter within last 12 months    Recent Outpatient Visits          5 months ago Routine general medical examination at a health care facility   [facility] [place], Timoteo Expose, MD   1 year ago Chest pain, unspecified type   [facility] at [employer], Bettey Mare, MD   1 year ago Migraine with aura and without status migrainosus, not intractable   [facility] [place], Timoteo Expose, MD   1 year ago Chest pain, unspecified type   [facility] at [place], Timoteo Expose, MD

## 2018-10-14 ENCOUNTER — Other Ambulatory Visit: Payer: Self-pay | Admitting: *Deleted

## 2018-10-14 DIAGNOSIS — G43109 Migraine with aura, not intractable, without status migrainosus: Secondary | ICD-10-CM

## 2018-10-14 MED ORDER — TOPIRAMATE 50 MG PO TABS: 100.0000 mg | ORAL_TABLET | Freq: Every day | ORAL | 2 refills | Status: DC

## 2018-10-14 NOTE — Telephone Encounter (Signed)
Rx sent in 10/14/2018 at 7:56 am with receipt confirmation.

## 2019-01-09 ENCOUNTER — Ambulatory Visit (INDEPENDENT_AMBULATORY_CARE_PROVIDER_SITE_OTHER): Payer: 59 | Admitting: Family Medicine

## 2019-01-09 ENCOUNTER — Encounter: Payer: Self-pay | Admitting: Family Medicine

## 2019-01-09 ENCOUNTER — Other Ambulatory Visit: Payer: Self-pay

## 2019-01-09 DIAGNOSIS — E669 Obesity, unspecified: Secondary | ICD-10-CM

## 2019-01-09 DIAGNOSIS — R7303 Prediabetes: Secondary | ICD-10-CM | POA: Diagnosis not present

## 2019-01-09 DIAGNOSIS — G43109 Migraine with aura, not intractable, without status migrainosus: Secondary | ICD-10-CM

## 2019-01-09 DIAGNOSIS — Z6838 Body mass index (BMI) 38.0-38.9, adult: Secondary | ICD-10-CM

## 2019-01-09 MED ORDER — TOPIRAMATE 100 MG PO TABS: 100.0000 mg | ORAL_TABLET | Freq: Every day | ORAL | 1 refills | Status: DC

## 2019-01-09 NOTE — Progress Notes (Signed)
Virtual Visit via Video Note   I connected with [name] on 01/09/19 at  4:00 PM EDT by a video enabled telemedicine application and verified that I am speaking with the correct person using two identifiers.  Location patient: home Location provider:work office Persons participating in the virtual visit: patient, provider  I discussed the limitations of evaluation and management by telemedicine and the availability of in person appointments. The patient expressed understanding and agreed to proceed.   HPI:  [name] is a 38 years old female who is following on backslash today. Last visit on 05/04/2018, Topamax was increased from 50 mg to 100 mg at bedtime. She is tolerating medication well. Since Topamax dose was increased she has noted great decrease in migraine frequency, 1 every 6 to 8 weeks. She is having headache once per week, which she states is not a migraine, milder than before, 5-6/10, frontal pressure headache. Alleviated by taking Ibuprofen x 1. She is not sure about exacerbating factors. No associated nausea, vomiting, photophobia.  She has been working from home since 09/2018, she has noted decreased appetite and some weight loss since she started Topamax. Decreased portion intake and not eating fast food. She is not sure how much weight she has lost, close feet abated. She has not exercising regularly.  Lab Results  Component Value Date   HGBA1C 5.9 05/04/2018   Denies abdominal pain, nausea,vomiting, polydipsia,polyuria, or polyphagia.    ROS: See pertinent positives and negatives per HPI.  Past Medical History:  Diagnosis Date  . Abnormal Pap smear   . Anemia   . Chicken pox   . Frequent headaches   . Herpes     Past Surgical History:  Procedure Laterality Date  . COLPOSCOPY    . SALPINGOOPHORECTOMY    . TUBAL LIGATION Bilateral 03/28/2013   Procedure: POST PARTUM TUBAL LIGATION;  Surgeon: Geryl RankinsEvelyn Varnado, MD;  Location: [facility];  Service: Gynecology;   Laterality: Bilateral;    Family History  Problem Relation Age of Onset  . Hypertension Maternal Grandmother   . Hypertension Mother   . Hypertension Father   . Cancer Neg Hx     Social History   Socioeconomic History  . Marital status: Single    Spouse name: Not on file  . Number of children: Not on file  . Years of education: Not on file  . Highest education level: Not on file  Occupational History  . Not on file  Social Needs  . Financial resource strain: Not on file  . Food insecurity    Worry: Not on file    Inability: Not on file  . Transportation needs    Medical: Not on file    Non-medical: Not on file  Tobacco Use  . Smoking status: Never Smoker  . Smokeless tobacco: Never Used  Substance and Sexual Activity  . Alcohol use: No  . Drug use: No  . Sexual activity: Not on file  Lifestyle  . Physical activity    Days per week: Not on file    Minutes per session: Not on file  . Stress: Not on file  Relationships  . Social Musicianconnections    Talks on phone: Not on file    Gets together: Not on file    Attends religious service: Not on file    Active member of club or organization: Not on file    Attends meetings of clubs or organizations: Not on file    Relationship status: Not on file  .  Intimate partner violence    Fear of current or ex partner: Not on file    Emotionally abused: Not on file    Physically abused: Not on file    Forced sexual activity: Not on file  Other Topics Concern  . Not on file  Social History Narrative  . Not on file     Current Outpatient Medications:  .  ferrous sulfate 325 (65 FE) MG tablet, Take 1 tablet (325 mg total) by mouth daily., Disp: 90 tablet, Rfl: 3 .  ibuprofen (ADVIL,MOTRIN) 600 MG tablet, 1 tablet every 8 hours with food a day before your menstrual periods and for 2-3 days., Disp: 30 tablet, Rfl: 1 .  SUMAtriptan (IMITREX) 50 MG tablet, Take 1 tablet (50 mg total) by mouth daily as needed for migraine. May repeat  in 2 hours if headache persists or recurs., Disp: 10 tablet, Rfl: 2 .  topiramate (TOPAMAX) 100 MG tablet, Take 1 tablet (100 mg total) by mouth at bedtime., Disp: 90 tablet, Rfl: 1  EXAM:  VITALS per patient if applicable:N/A  GENERAL: alert, oriented, appears well and in no acute distress  HEENT: atraumatic, normocephalic conjunttiva clear, no obvious facial labnormalities on inspection.  LUNGS: on inspection no signs of respiratory distress, breathing rate appears normal, no obvious gross SOB, gasping or wheezing  CV: no obvious cyanosis  MS: moves all visible extremities without noticeable abnormality  PSYCH/NEURO: pleasant and cooperative, no obvious depression or anxiety, speech and thought processing grossly intact.  No focal deficit.  ASSESSMENT AND PLAN:  Discussed the following assessment and plan:  Migraine headache with aura Great improvement in headache frequency and intensity. We discussed some side effects of Topamax, she understand the importance of avoiding pregnancy due to teratogenic effect. Instructed about warning signs. Follow-up in 6 months.  Class 2 obesity with body mass index (BMI) of 38.0 to 38.9 in adult We discussed benefits of wt loss as well as adverse effects of obesity. Consistency with healthy diet and physical activity recommended.  Prediabetes Healthy lifestyle and weight loss recommended for primary prevention. We will plan on checking A1c in 5 months.    I discussed the assessment and treatment plan with the patient.  She was provided an opportunity to ask questions and all were answered. The patient agreed with the plan and demonstrated an understanding of the instructions.   Return in about 5 months (around 06/11/2019) for cpe.     [place], MD

## 2019-01-09 NOTE — Assessment & Plan Note (Signed)
We discussed benefits of wt loss as well as adverse effects of obesity. Consistency with healthy diet and physical activity recommended.  

## 2019-01-09 NOTE — Assessment & Plan Note (Signed)
Healthy lifestyle and weight loss recommended for primary prevention. We will plan on checking A1c in 5 months.

## 2019-01-09 NOTE — Assessment & Plan Note (Signed)
Great improvement in headache frequency and intensity. We discussed some side effects of Topamax, she understand the importance of avoiding pregnancy due to teratogenic effect. Instructed about warning signs. Follow-up in 6 months.

## 2019-01-19 ENCOUNTER — Other Ambulatory Visit: Payer: Self-pay | Admitting: Family Medicine

## 2019-01-19 DIAGNOSIS — G43109 Migraine with aura, not intractable, without status migrainosus: Secondary | ICD-10-CM

## 2019-05-26 ENCOUNTER — Other Ambulatory Visit: Payer: Self-pay | Admitting: Family Medicine

## 2019-05-26 DIAGNOSIS — G43109 Migraine with aura, not intractable, without status migrainosus: Secondary | ICD-10-CM

## 2019-05-29 NOTE — Telephone Encounter (Signed)
Okay to fill. This was d/c'd by you on 01/09/2019

## 2019-07-11 ENCOUNTER — Other Ambulatory Visit: Payer: Self-pay | Admitting: Family Medicine

## 2019-07-11 MED ORDER — TOPIRAMATE 100 MG PO TABS: 100.0000 mg | ORAL_TABLET | Freq: Every day | ORAL | 0 refills | Status: DC

## 2019-10-01 ENCOUNTER — Other Ambulatory Visit: Payer: Self-pay | Admitting: Family Medicine

## 2019-10-09 ENCOUNTER — Other Ambulatory Visit: Payer: Self-pay

## 2019-10-10 ENCOUNTER — Encounter: Payer: Self-pay | Admitting: Family Medicine

## 2019-10-10 ENCOUNTER — Ambulatory Visit (INDEPENDENT_AMBULATORY_CARE_PROVIDER_SITE_OTHER): Payer: 59 | Admitting: Family Medicine

## 2019-10-10 VITALS — BP 130/80 | HR 96 | Resp 12 | Ht 64.0 in | Wt 222.4 lb

## 2019-10-10 DIAGNOSIS — N938 Other specified abnormal uterine and vaginal bleeding: Secondary | ICD-10-CM

## 2019-10-10 DIAGNOSIS — R292 Abnormal reflex: Secondary | ICD-10-CM | POA: Diagnosis not present

## 2019-10-10 DIAGNOSIS — G43109 Migraine with aura, not intractable, without status migrainosus: Secondary | ICD-10-CM | POA: Diagnosis not present

## 2019-10-10 DIAGNOSIS — D509 Iron deficiency anemia, unspecified: Secondary | ICD-10-CM | POA: Diagnosis not present

## 2019-10-10 DIAGNOSIS — M5441 Lumbago with sciatica, right side: Secondary | ICD-10-CM

## 2019-10-10 LAB — CBC WITH DIFFERENTIAL/PLATELET
Basophils Absolute: 0.1 10*3/uL (ref 0.0–0.1)
Basophils Relative: 1 % (ref 0.0–3.0)
Eosinophils Absolute: 0.1 10*3/uL (ref 0.0–0.7)
Eosinophils Relative: 0.8 % (ref 0.0–5.0)
HCT: 29 % — ABNORMAL LOW (ref 36.0–46.0)
Hemoglobin: 9.1 g/dL — ABNORMAL LOW (ref 12.0–15.0)
Lymphocytes Relative: 25.3 % (ref 12.0–46.0)
Lymphs Abs: 1.7 10*3/uL (ref 0.7–4.0)
MCHC: 31.4 g/dL (ref 30.0–36.0)
MCV: 69.2 fl — ABNORMAL LOW (ref 78.0–100.0)
Monocytes Absolute: 0.7 10*3/uL (ref 0.1–1.0)
Monocytes Relative: 10.8 % (ref 3.0–12.0)
Neutro Abs: 4.3 10*3/uL (ref 1.4–7.7)
Neutrophils Relative %: 62.1 % (ref 43.0–77.0)
Platelets: 295 10*3/uL (ref 150.0–400.0)
RBC: 4.2 Mil/uL (ref 3.87–5.11)
RDW: 19 % — ABNORMAL HIGH (ref 11.5–15.5)
WBC: 6.8 10*3/uL (ref 4.0–10.5)

## 2019-10-10 LAB — FERRITIN: Ferritin: 3.1 ng/mL — ABNORMAL LOW (ref 10.0–291.0)

## 2019-10-10 MED ORDER — TOPIRAMATE 100 MG PO TABS: 100.0000 mg | ORAL_TABLET | Freq: Every day | ORAL | 1 refills | Status: DC

## 2019-10-10 MED ORDER — FERROUS SULFATE 325 (65 FE) MG PO TABS: 325.0000 mg | ORAL_TABLET | Freq: Every day | ORAL | 3 refills | Status: DC

## 2019-10-10 MED ORDER — SUMATRIPTAN SUCCINATE 50 MG PO TABS: 50.0000 mg | ORAL_TABLET | Freq: Every day | ORAL | 2 refills | Status: DC | PRN

## 2019-10-10 MED ORDER — PREDNISONE 20 MG PO TABS: ORAL_TABLET | ORAL | 0 refills | Status: AC

## 2019-10-10 MED ORDER — KETOROLAC TROMETHAMINE 60 MG/2ML IM SOLN
60.0000 mg | Freq: Once | INTRAMUSCULAR | Status: AC
  Administered 2019-10-10: 60 mg via INTRAMUSCULAR

## 2019-10-10 NOTE — Progress Notes (Signed)
ACUTE VISIT   HPI:  Chief Complaint  Patient presents with  . Back Pain    Zoe Randall is a 39 y.o. female, who is here today with above complain. She had problem before but it was "minor" and infrequent. For the past year problem has gradually getting worse and it is now constant.  Negative for any recent injury or unusual level of activity.  Pain is radiated to RLE, from buttock down leg, achy like, 8/10 in intensity, with no associated urinary incontinence or retention, stool incontinence, or saddle anesthesia. Sometimes she has numbness. Exacerbated by prolonged sitting. Alleviated by walking for a few minutes It does not interfere with sleep.  No fever,chills,unusual fatigue,rash or edema on area,or abnormal wt loss.   OTC medications: Ibuprofen,she has not taken any today.  Iron def anemia: She has not been taking ferrous sulfate, she is afraid of getting constipated. + Pica, ice. Heavy menstrual periods. She follows with gyn. LMP 09/22/19.  Negative for abdominal pain,melen,blood in stool, or gross hematuria.  Lab Results  Component Value Date   WBC 6.4 05/04/2018   HGB 9.2 (L) 05/04/2018   HCT 29.5 (L) 05/04/2018   MCV 67.2 Repeated and verified X2. (L) 05/04/2018   PLT 252.0 05/04/2018   Migraine headaches: She needs refills for Imitrex. She is on Topamax 100 mg daily. Tolerating medication well. Migraine are less frequent, "every once in a while." No new associated symptom.   Review of Systems  Constitutional: Negative for appetite change.  HENT: Negative for mouth sores and nosebleeds.   Eyes: Negative for redness and visual disturbance.  Respiratory: Negative for cough, shortness of breath and wheezing.   Cardiovascular: Negative for chest pain, palpitations and leg swelling.  Gastrointestinal: Negative for nausea and vomiting.       Negative for changes in bowel habits.  Genitourinary: Negative for decreased urine volume and  dysuria.  Musculoskeletal: Negative for gait problem and joint swelling.  Neurological: Negative for weakness and headaches.  Rest see pertinent positives and negatives per HPI.   Current Outpatient Medications on File Prior to Visit  Medication Sig Dispense Refill  . ibuprofen (ADVIL,MOTRIN) 600 MG tablet 1 tablet every 8 hours with food a day before your menstrual periods and for 2-3 days. 30 tablet 1   No current facility-administered medications on file prior to visit.     Past Medical History:  Diagnosis Date  . Abnormal Pap smear   . Anemia   . Chicken pox   . Frequent headaches   . Herpes    No Known Allergies  Social History   Socioeconomic History  . Marital status: Single    Spouse name: Not on file  . Number of children: Not on file  . Years of education: Not on file  . Highest education level: Not on file  Occupational History  . Not on file  Tobacco Use  . Smoking status: Never Smoker  . Smokeless tobacco: Never Used  Substance and Sexual Activity  . Alcohol use: No  . Drug use: No  . Sexual activity: Not on file  Other Topics Concern  . Not on file  Social History Narrative  . Not on file   Social Determinants of Health   Financial Resource Strain:   . Difficulty of Paying Living Expenses:   Food Insecurity:   . Worried About Programme researcher, broadcasting/film/video in the Last Year:   . [employer] of Food in the Last  Year:   Transportation Needs:   . Freight forwarder (Medical):   Marland Kitchen Lack of Transportation (Non-Medical):   Physical Activity:   . Days of Exercise per Week:   . Minutes of Exercise per Session:   Stress:   . Feeling of Stress :   Social Connections:   . Frequency of Communication with Friends and Family:   . Frequency of Social Gatherings with Friends and Family:   . Attends Religious Services:   . Active Member of Clubs or Organizations:   . Attends Banker Meetings:   Marland Kitchen Marital Status:     Vitals:   10/10/19 0655  BP:  130/80  Pulse: 96  Resp: 12  SpO2: 99%    Wt Readings from Last 3 Encounters:  10/10/19 222 lb 6 oz (100.9 kg)  05/04/18 222 lb 8 oz (100.9 kg)  05/13/17 224 lb (101.6 kg)   Body mass index is 38.17 kg/m.  Physical Exam  Nursing note and vitals reviewed. Constitutional: She is oriented to person, place, and time. She appears well-developed. She does not appear ill. No distress.  HENT:  Head: Normocephalic and atraumatic.  Eyes: Pupils are equal, round, and reactive to light. Conjunctivae are normal.  Cardiovascular: Normal rate and regular rhythm.  Respiratory: Effort normal and breath sounds normal. No respiratory distress.  GI: Soft. She exhibits no mass. There is no hepatomegaly. There is no abdominal tenderness.  Musculoskeletal:        General: No edema.     Comments: No significant deformity appreciated. No tenderness upon palpation of paraspinal muscles. Mild pain elicited with movement on exam table during examination. No local edema or erythema appreciated, no suspicious lesions.  Lymphadenopathy:    She has no cervical adenopathy.  Neurological: She is alert and oriented to person, place, and time. She has normal strength. Gait normal.  Reflex Scores:      Patellar reflexes are 1+ on the right side and 2+ on the left side. SLR positive right.  Skin: Skin is warm. No rash noted. No erythema.  Psychiatric: She has a normal mood and affect.  Well groomed, good eye contact.    ASSESSMENT AND PLAN:  [name] was seen today for back pain.  Diagnoses and all orders for this visit:  Right-sided low back pain with right-sided sciatica, unspecified chronicity Because RLE numbness and worsening symptoms + decreased right patellar reflex, lumbar MRI will be arranged. We discussed some side effects of prednisone, recommend taking it with food. Instructed about warning signs. Here in the office after verbal consent she received Toradol 60 mg IM.  -     predniSONE  (DELTASONE) 20 MG tablet; 3 tabs for 3 days, 2 tabs for 3 days, 1 tabs for 3 days, and 1/2 tab for 3 days. Take tables together with breakfast. -     MR Lumbar Spine Wo Contrast; Future -     ketorolac (TORADOL) injection 60 mg  Decreased patellar reflex No focal weakness. Instructed about warning signs. -     MR Lumbar Spine Wo Contrast; Future  Migraine with aura and without status migrainosus, not intractable Problem is well controlled. No changes in current management.  -     topiramate (TOPAMAX) 100 MG tablet; Take 1 tablet (100 mg total) by mouth daily. -     ferrous sulfate 325 (65 FE) MG tablet; Take 1 tablet (325 mg total) by mouth daily..  Iron deficiency anemia, unspecified iron deficiency anemia type We discussed  possible complications of anemia, she agrees with resuming ferrous sulfate 325 mg, which she can take every other day with vitamin C.  -     CBC with Differential/Platelet -     Ferritin  DUB (dysfunctional uterine bleeding) Recommend having a discussion with her gynecologist about hormonal therapy or other treatment to help with heavy menses.  Return if symptoms worsen or fail to improve, for Needs a cpe..  Veneda Kirksey G. [place], MD  [facility]. [place] office.  A few things to remember from today's visit: Orders Placed This Encounter  Procedures  . MR Lumbar Spine Wo Contrast  . CBC with Differential/Platelet  . Ferritin    Take prednisone with breakfast. Avoid prolonged sitting. Today you received Toradol injection, you can take ibuprofen in 6 hours. Somebody will be calling you for lumbar MRI.  If sudden worsening pain, urine or bowel incontinence or local weakness please go to the ER.   Please be sure medication list is accurate. If a new problem present, please set up appointment sooner than planned today.

## 2019-10-10 NOTE — Patient Instructions (Addendum)
A few things to remember from today's visit: Orders Placed This Encounter  Procedures  . MR Lumbar Spine Wo Contrast  . CBC with Differential/Platelet  . Ferritin    Take prednisone with breakfast. Avoid prolonged sitting. Today you received Toradol injection, you can take ibuprofen in 6 hours. Somebody will be calling you for lumbar MRI.  If sudden worsening pain, urine or bowel incontinence or local weakness please go to the ER.   Please be sure medication list is accurate. If a new problem present, please set up appointment sooner than planned today.

## 2019-10-12 ENCOUNTER — Encounter: Payer: Self-pay | Admitting: Family Medicine

## 2019-11-03 ENCOUNTER — Ambulatory Visit
Admission: RE | Admit: 2019-11-03 | Discharge: 2019-11-03 | Disposition: A | Discharge status: Home / Self Care | Payer: 59 | Source: Ambulatory Visit | Attending: Family Medicine | Admitting: Family Medicine

## 2019-11-03 ENCOUNTER — Other Ambulatory Visit: Payer: Self-pay

## 2019-11-03 ENCOUNTER — Other Ambulatory Visit: Payer: Self-pay | Admitting: *Deleted

## 2019-11-03 DIAGNOSIS — M5441 Lumbago with sciatica, right side: Secondary | ICD-10-CM

## 2019-11-03 DIAGNOSIS — R292 Abnormal reflex: Secondary | ICD-10-CM

## 2019-11-15 ENCOUNTER — Other Ambulatory Visit: Payer: Self-pay

## 2019-11-15 ENCOUNTER — Ambulatory Visit (INDEPENDENT_AMBULATORY_CARE_PROVIDER_SITE_OTHER): Payer: 59 | Admitting: Orthopaedic Surgery

## 2019-11-15 ENCOUNTER — Encounter: Payer: Self-pay | Admitting: Orthopaedic Surgery

## 2019-11-15 ENCOUNTER — Ambulatory Visit (INDEPENDENT_AMBULATORY_CARE_PROVIDER_SITE_OTHER): Payer: 59

## 2019-11-15 VITALS — BP 126/88 | HR 91 | Ht 63.0 in | Wt 222.0 lb

## 2019-11-15 DIAGNOSIS — M545 Low back pain: Secondary | ICD-10-CM

## 2019-11-15 DIAGNOSIS — G8929 Other chronic pain: Secondary | ICD-10-CM

## 2019-11-15 DIAGNOSIS — M5126 Other intervertebral disc displacement, lumbar region: Secondary | ICD-10-CM

## 2019-11-15 NOTE — Progress Notes (Addendum)
Office Visit Note   Patient: Zoe Randall           Date of Birth: Sep 29, 1980           MRN: [id number] Visit Date: 11/15/2019              Requested by: [place], Betty G, MD [address] [place],  [place] [address] PCP: [place], Betty G, MD   Assessment & Plan: Visit Diagnoses:  1. Chronic right-sided low back pain, unspecified whether sciatica present   2. Protrusion of lumbar intervertebral disc     Plan: Patient has a large disc protrusion with significant pain rated 8 out of 10 progressed with progressive leg weakness.  She has been treated conservatively by Dr. Betty [place]and for greater than a month and now is had progressive right leg weakness.  She has been on ibuprofen.  Prednisone was not used due to her prediabetes.  We discussed options and due to severe pain that bothers her on a daily basis with working she would like to proceed with microdiscectomy.  We discussed overnight stay in the hospital restriction of doing much sitting for about 6 weeks.  Should be on a walking program which also should help with some weight loss since she be able to resume walking without significant right leg pain.  MRI scan was reviewed with patient as well as plain radiographs.  Questions were elicited and answered.  She understands request to proceed.  Follow-Up Instructions: Preop for right L5-S1 microdiscectomy with overnight observation status.  Orders:  Orders Placed This Encounter  Procedures  . XR Lumbar Spine 2-3 Views   No orders of the defined types were placed in this encounter.     Procedures: No procedures performed   Clinical Data: No additional findings.   Subjective: Chief Complaint  Patient presents with  . 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  She has problems  going to sleep problems with prolonged sitting significant increased pain with bending turning and twisting.  She denies any associated bowel bladder symptoms.  Past history of occasional back discomfort off and on nothing severe until 1 year ago when she started having back pain which was moderate and now has increased to severe with progressive right leg weakness.  She was seen by Dr. Betty [place] more than a month ago placed on ibuprofen.  Ibuprofen did not help her pain.  Due to her severe pain severe intensity MRI scan was obtained which shows large disc protrusion L5-S1 with right S1 nerve root compression.  Patient has noted problems going up and down steps with right leg weakness.  Patient lives with her husband.  Review of Systems positive for history of migraines.  BMI 39 with prediabetes.  Positive history of iron deficiency anemia otherwise noncontributory to HPI.   Objective: Vital Signs: BP 126/88   Pulse 91   Ht 5\' 3"  (1.6 m)   Wt 222 lb (100.7 kg)   BMI 39.33 kg/m   Physical Exam Constitutional:      Appearance: She is well-developed.  HENT:     Head: Normocephalic.     Right Ear: External ear normal.     Left Ear: External ear normal.  Eyes:     Pupils: Pupils are  equal, round, and reactive to light.  Neck:     Thyroid: No thyromegaly.     Trachea: No tracheal deviation.  Cardiovascular:     Rate and Rhythm: Normal rate.  Pulmonary:     Effort: Pulmonary effort is normal.  Abdominal:     Palpations: Abdomen is soft.  Skin:    General: Skin is warm and dry.  Neurological:     Mental Status: She is alert and oriented to person, place, and time.  Psychiatric:        Behavior: Behavior normal.     Ortho Exam patient has positive straight leg raising right leg 45 degrees negative on the left at 90 degrees.  Knee jerk is 2+ ankle jerk is absent on the right.  She is not able to do toe raises on the right due to gastrocsoleus weakness she can do them on the left  easily.  Anterior tib EHL is strong.  She has lateral right foot loss of sensation and plantar sensation.  Normal on the left.  Positive popliteal compression test positive sciatic notch tenderness right side only.  Trochanters and hip range of motion are normal.  Good quad and hip adductor strength.  Hamstrings are normal distal pulses are 2+.  Specialty Comments:  No specialty comments available.  Imaging: CLINICAL DATA:  Low back pain with radiculopathy. Right leg pain and numbness  EXAM: MRI LUMBAR SPINE WITHOUT CONTRAST  TECHNIQUE: Multiplanar, multisequence MR imaging of the lumbar spine was performed. No intravenous contrast was administered.  COMPARISON:  None.  FINDINGS: Segmentation:  Normal  Alignment:  Normal  Vertebrae: Negative for fracture or mass. Discogenic changes at L5-S1 with sclerotic and fatty changes.  Conus medullaris and cauda equina: Conus extends to the L1-2 level. Conus and cauda equina appear normal.  Paraspinal and other soft tissues: Negative for paraspinous mass or adenopathy  Disc levels:  L1-2: Negative  L2-3: Negative  L3-4: Negative  L4-5: Normal disc.  Mild facet degeneration.  Negative for stenosis  L5-S1: Moderately large disc protrusion on the right. Probable extruded disc fragment compressing the right S1 nerve root.  IMPRESSION: Moderately large disc protrusion on the right L5-S1 with right S1 nerve root impingement   Electronically Signed   By: Franchot Gallo M.D.   On: 11/03/2019 12:49       PMFS History: Patient Active Problem List   Diagnosis Date Noted  . Protrusion of lumbar intervertebral disc 11/15/2019  . Prediabetes 01/09/2019  . Iron deficiency anemia 05/04/2018  . Class 2 obesity with body mass index (BMI) of 38.0 to 38.9 in adult 10/19/2016  . GERD (gastroesophageal reflux disease) 10/19/2016  . Migraine headache with aura 10/19/2016   Past Medical History:  Diagnosis Date  .  Abnormal Pap smear   . Anemia   . Chicken pox   . Frequent headaches   . Herpes     Family History  Problem Relation Age of Onset  . Hypertension Maternal Grandmother   . Hypertension Mother   . Hypertension Father   . Cancer Neg Hx     Past Surgical History:  Procedure Laterality Date  . COLPOSCOPY    . SALPINGOOPHORECTOMY    . TUBAL LIGATION Bilateral 03/28/2013   Procedure: POST PARTUM TUBAL LIGATION;  Surgeon: Thurnell Lose, MD;  Location: [facility];  Service: Gynecology;  Laterality: Bilateral;   Social History   Occupational History  . Not on file  Tobacco Use  . Smoking status: Never Smoker  .  Smokeless tobacco: Never Used  Substance and Sexual Activity  . Alcohol use: No  . Drug use: No  . Sexual activity: Not on file

## 2019-11-18 ENCOUNTER — Other Ambulatory Visit: Payer: 59

## 2019-11-20 ENCOUNTER — Other Ambulatory Visit: Payer: Self-pay

## 2019-11-21 ENCOUNTER — Encounter: Payer: Self-pay | Admitting: Family Medicine

## 2019-11-21 ENCOUNTER — Ambulatory Visit (INDEPENDENT_AMBULATORY_CARE_PROVIDER_SITE_OTHER): Payer: 59 | Admitting: Family Medicine

## 2019-11-21 VITALS — BP 110/80 | HR 96 | Temp 98.0°F | Resp 12 | Ht 63.0 in | Wt 225.0 lb

## 2019-11-21 DIAGNOSIS — R7303 Prediabetes: Secondary | ICD-10-CM | POA: Diagnosis not present

## 2019-11-21 DIAGNOSIS — Z01818 Encounter for other preprocedural examination: Secondary | ICD-10-CM | POA: Diagnosis not present

## 2019-11-21 DIAGNOSIS — D509 Iron deficiency anemia, unspecified: Secondary | ICD-10-CM | POA: Diagnosis not present

## 2019-11-21 LAB — CBC
HCT: 34.4 % — ABNORMAL LOW (ref 36.0–46.0)
Hemoglobin: 11 g/dL — ABNORMAL LOW (ref 12.0–15.0)
MCHC: 31.8 g/dL (ref 30.0–36.0)
MCV: 74 fl — ABNORMAL LOW (ref 78.0–100.0)
Platelets: 279 10*3/uL (ref 150.0–400.0)
RBC: 4.65 Mil/uL (ref 3.87–5.11)
RDW: 22.8 % — ABNORMAL HIGH (ref 11.5–15.5)
WBC: 7.7 10*3/uL (ref 4.0–10.5)

## 2019-11-21 LAB — GLUCOSE, RANDOM: Glucose, Bld: 87 mg/dL (ref 70–99)

## 2019-11-21 LAB — HEMOGLOBIN A1C: Hgb A1c MFr Bld: 5.5 % (ref 4.6–6.5)

## 2019-11-21 NOTE — Patient Instructions (Signed)
A few things to remember from today's visit:   Continue iron supplementation. Work on wt loss before surgery. Stop all supplements, Ibuprofen,naproxen,and aspirin at least 7 days before procedure. You can take Topamax the night before as usual. If any pain just tylenol.   If you need refills please call your pharmacy. Do not use My Chart to request refills or for acute issues that need immediate attention.    Please be sure medication list is accurate. If a new problem present, please set up appointment sooner than planned today.

## 2019-11-21 NOTE — Progress Notes (Signed)
Chief Complaint  Patient presents with  . SURGICAL CLEARANCE    HPI:  Zoe Randall is a 39 y.o. female, who is here today for surgical clearance requested by Dr Ophelia Charter. Lower back pain getting worse. Planning on having right L5-S1 microdiscectomy, tentative date 01/05/20.  BTL under general anesthesia in 03/2013. She does not remember any complication during or after procedure. No complications related to anesthesia.  No hx of HTN,DM,CAD,CHF,or asthma. No tobacco use. Migraine headache on Imitrex prn and Topamax.  Negative for any CP,SOB,palpitations, diaphoresis, or dizziness when climbing a flight of stairs or walking a hill.  Anemia: Hx of heavy menses. She is on iron supplementation. Pica improved, ice. Negative for worsening headache or unusual fatigue. LMP 10/24/19.  Component     Latest Ref Rng & Units 05/04/2018  WBC     4.0 - 10.5 K/uL 6.4  RBC     3.87 - 5.11 Mil/uL 4.38  Hemoglobin     12.0 - 15.0 g/dL 9.2 (L)  HCT     01.0 - 46.0 % 29.5 (L)  MCV     78.0 - 100.0 fl 67.2 Repeated and verified X2. (L)  MCHC     30.0 - 36.0 g/dL 93.2  RDW     35.5 - 73.2 % 19.1 (H)  Platelets     150.0 - 400.0 K/uL 252.0  Neutrophils     43.0 - 77.0 %   Lymphocytes     12.0 - 46.0 %   Monocytes Relative     3.0 - 12.0 %   Eosinophil     0.0 - 5.0 %   Basophil     0.0 - 3.0 %   NEUT#     1.4 - 7.7 K/uL   Lymphocyte #     0.7 - 4.0 K/uL   Monocyte #     0.1 - 1.0 K/uL   Eosinophils Absolute     0.0 - 0.7 K/uL   Basophils Absolute     0.0 - 0.1 K/uL    Prediabetes: Negative for polydipsia,polyuria, or polyphagia.  Last HgA1C 5.9 in 04/2018. According to pt,she is having pre op labs around the time of surgery.   Review of Systems  Constitutional: Negative for activity change, appetite change and fever.  HENT: Negative for mouth sores, nosebleeds and sore throat.   Eyes: Negative for redness and visual disturbance.  Respiratory: Negative for  cough and wheezing.   Cardiovascular: Negative for leg swelling.  Gastrointestinal: Negative for abdominal pain, nausea and vomiting.       Negative for changes in bowel habits.  Genitourinary: Negative for decreased urine volume, dysuria and hematuria.  Musculoskeletal: Positive for back pain. Negative for gait problem.  Skin: Negative for rash and wound.  Neurological: Negative for syncope and weakness.  Rest of ROS, see pertinent positives sand negatives in HPI  Current Outpatient Medications on File Prior to Visit  Medication Sig Dispense Refill  . ferrous sulfate 325 (65 FE) MG tablet Take 1 tablet (325 mg total) by mouth daily. 90 tablet 3  . SUMAtriptan (IMITREX) 50 MG tablet Take 1 tablet (50 mg total) by mouth daily as needed for migraine. May repeat in 2 hours if headache persists or recurs. 10 tablet 2  . topiramate (TOPAMAX) 100 MG tablet Take 1 tablet (100 mg total) by mouth daily. 90 tablet 1  . ibuprofen (ADVIL,MOTRIN) 600 MG tablet 1 tablet every 8 hours with food a day before your  menstrual periods and for 2-3 days. (Patient not taking: Reported on 11/15/2019) 30 tablet 1   No current facility-administered medications on file prior to visit.    Past Medical History:  Diagnosis Date  . Abnormal Pap smear   . Anemia   . Chicken pox   . Frequent headaches   . Herpes    No Known Allergies  Social History   Socioeconomic History  . Marital status: Single    Spouse name: Not on file  . Number of children: Not on file  . Years of education: Not on file  . Highest education level: Not on file  Occupational History  . Not on file  Tobacco Use  . Smoking status: Never Smoker  . Smokeless tobacco: Never Used  Substance and Sexual Activity  . Alcohol use: No  . Drug use: No  . Sexual activity: Not on file  Other Topics Concern  . Not on file  Social History Narrative  . Not on file   Social Determinants of Health   Financial Resource Strain:   . Difficulty  of Paying Living Expenses:   Food Insecurity:   . Worried About Charity fundraiser in the Last Year:   . Arboriculturist in the Last Year:   Transportation Needs:   . Film/video editor (Medical):   Marland Kitchen Lack of Transportation (Non-Medical):   Physical Activity:   . Days of Exercise per Week:   . Minutes of Exercise per Session:   Stress:   . Feeling of Stress :   Social Connections:   . Frequency of Communication with Friends and Family:   . Frequency of Social Gatherings with Friends and Family:   . Attends Religious Services:   . Active Member of Clubs or Organizations:   . Attends Archivist Meetings:   Marland Kitchen Marital Status:     Vitals:   11/21/19 0705  BP: 110/80  Pulse: 96  Resp: 12  Temp: 98 F (36.7 C)  SpO2: 99%   Body mass index is 39.86 kg/m.  Physical Exam  Nursing note and vitals reviewed. Constitutional: She is oriented to person, place, and time. She appears well-developed. No distress.  HENT:  Head: Normocephalic and atraumatic.  Mouth/Throat: Oropharynx is clear and moist and mucous membranes are normal.  Eyes: Pupils are equal, round, and reactive to light. Conjunctivae are normal.  Cardiovascular: Normal rate and regular rhythm.  No murmur heard. Pulses:      Dorsalis pedis pulses are 2+ on the right side and 2+ on the left side.  Respiratory: Effort normal and breath sounds normal. No respiratory distress.  GI: Soft. She exhibits no mass. There is no hepatomegaly. There is no abdominal tenderness.  Musculoskeletal:        General: No edema.  Lymphadenopathy:    She has no cervical adenopathy.  Neurological: She is alert and oriented to person, place, and time. She has normal strength. No cranial nerve deficit. Gait normal.  Skin: Skin is warm. No rash noted. No erythema.  Psychiatric: She has a normal mood and affect.  Well groomed, good eye contact.   ASSESSMENT AND PLAN:  Ms. FRANSISCA SHAWN was seen today for pre op evaluation.   Orders Placed This Encounter  Procedures  . CBC  . Hemoglobin A1c  . Glucose, random   Lab Results  Component Value Date   WBC 7.7 11/21/2019   HGB 11.0 (L) 11/21/2019   HCT 34.4 (L) 11/21/2019  MCV 74.0 (L) 11/21/2019   PLT 279.0 11/21/2019   Lab Results  Component Value Date   HGBA1C 5.5 11/21/2019    1. Preop examination Lw risk for CV and non CV complications. She is having pre op labs at the hospital a few days before surgery. DVT prophylaxis: Early ambulation. Stop all NSAID's at least 7 days before surgery. Hold Topamax the day before and resume 12-24 hours after. Stop all OTC supplements.  2. Prediabetes Healthy life style for primary prevention. Further recommendations according to lab results.  3. Iron deficiency anemia, unspecified iron deficiency anemia type No changes in current management, will follow labs done today and will give further recommendations accordingly.   No follow-ups on file.    Betty G. [place], MD  [facility]. [place] office.   A few things to remember from today's visit:   Continue iron supplementation. Work on wt loss before surgery. Stop all supplements, Ibuprofen,naproxen,and aspirin at least 7 days before procedure. You can take Topamax the night before as usual. If any pain just tylenol.   If you need refills please call your pharmacy. Do not use My Chart to request refills or for acute issues that need immediate attention.    Please be sure medication list is accurate. If a new problem present, please set up appointment sooner than planned today.

## 2019-11-25 ENCOUNTER — Encounter: Payer: Self-pay | Admitting: Family Medicine

## 2019-12-19 ENCOUNTER — Other Ambulatory Visit: Payer: Self-pay

## 2019-12-20 ENCOUNTER — Other Ambulatory Visit: Payer: Self-pay

## 2019-12-20 ENCOUNTER — Encounter: Payer: Self-pay | Admitting: Surgery

## 2019-12-20 ENCOUNTER — Ambulatory Visit (INDEPENDENT_AMBULATORY_CARE_PROVIDER_SITE_OTHER): Payer: No Typology Code available for payment source | Admitting: Surgery

## 2019-12-20 VITALS — BP 122/88 | HR 78 | Ht 63.0 in | Wt 225.0 lb

## 2019-12-20 DIAGNOSIS — M5126 Other intervertebral disc displacement, lumbar region: Secondary | ICD-10-CM

## 2019-12-20 NOTE — Progress Notes (Signed)
39 year old black female history of right L5-S1 HNP, low back pain, right lower extremity and right leg weakness comes in for preop evaluation.  States that symptoms unchanged from previous visit and she is wanting to proceed with right L5-S1 microdiscectomy as scheduled.  Today history and physical performed.  Review of systems negative.  Patient does have right anterior tib and gastroc weakness on exam but she also has pain throughout her leg with resistance testing in these areas.  I hope that this is more giveaway weakness secondary to pain.  Advised patient that we will see how she is after surgery.  All questions answered.

## 2019-12-27 ENCOUNTER — Ambulatory Visit: Payer: No Typology Code available for payment source | Admitting: Orthopaedic Surgery

## 2019-12-27 NOTE — Pre-Procedure Instructions (Signed)
[id number] G[place] Otto, [place] - [address] [place] [address] Phone: [phone] Fax: [phone]    Your procedure is scheduled on Fri., January 05, 2020 from 7:30AM-9:39AM   Report to [facility] Entrance "A" at 5:30AM  Call this number if you have problems the morning of surgery:  [phone]   Remember:  Do not eat after midnight on June 17th  You may drink clear liquids until 3 hours (4:30AM) prior to surgery time.  Clear liquids allowed are: Water, Juice (non-citric and without pulp - diabetics please choose diet or no sugar options), Carbonated beverages - (diabetics please choose diet or no sugar options), Clear Tea, Black Coffee only (no creamer, milk or cream including half and half), Plain Jell-O only (diabetics please choose diet or no sugar options), Gatorade (diabetics please choose diet or no sugar options) and Plain Popsicles only   Enhanced Recovery after Surgery for Orthopedics Enhanced Recovery after Surgery is a protocol used to improve the stress on your body and your recovery after surgery.  Patient Instructions    The day of surgery (if you do NOT have diabetes): Drink By: 4:30AM o Drink ONE (1) Pre-Surgery Clear Ensure as directed.   o This drink was given to you during your hospital  pre-op appointment visit. o Finish the drink at the designated time by the pre-op nurse.  o Nothing else to drink after completing the  Pre-Surgery Clear Ensure.         If you have questions, please contact your surgeons office.   Take these medicines the morning of surgery with A SIP OF WATER: None  7 days before surgery (12/29/19), STOP taking all Aspirin (unless instructed by your doctor) and Other Aspirin containing products, Vitamins, Fish oils, and Herbal medications. Also stop all NSAIDS i.e. Advil, Ibuprofen, Motrin, Aleve, Anaprox, Naproxen, BC, Goody Powders, and all Supplements.  No Smoking of any kind, Tobacco, or Alcohol  products 24 hours prior to your procedure. If you use a Cpap at night, you may bring all equipment for your overnight stay.   Do not wear jewelry, make-up or nail polish.  Do not wear lotions, powders, or perfumes, or deodorant.  Do not shave 48 hours prior to surgery.    Do not bring valuables to the hospital.  [facility] is not responsible for any belongings or valuables.  Contacts, dentures or bridgework may not be worn into surgery.  For patients admitted to the hospital, discharge time will be determined by your treatment team.  Patients discharged the day of surgery will not be allowed to drive home, and someone age 38 needs to stay with them for 24 hours.   Special instructions:   [place]- Preparing For Surgery  Before surgery, you can play an important role. Because skin is not sterile, your skin needs to be as free of germs as possible. You can reduce the number of germs on your skin by washing with CHG (chlorahexidine gluconate) Soap before surgery.  CHG is an antiseptic cleaner which kills germs and bonds with the skin to continue killing germs even after washing.    Oral Hygiene is also important to reduce your risk of infection.  Remember - BRUSH YOUR TEETH THE MORNING OF SURGERY WITH YOUR REGULAR TOOTHPASTE  Please do not use if you have an allergy to CHG or antibacterial soaps. If your skin becomes reddened/irritated stop using the CHG.  Do not shave (including legs and underarms) for at least  48 hours prior to first CHG shower. It is OK to shave your face.  Please follow these instructions carefully.   1. Shower the NIGHT BEFORE SURGERY and the MORNING OF SURGERY with CHG.   2. If you chose to wash your hair, wash your hair first as usual with your normal shampoo.  3. After you shampoo, rinse your hair and body thoroughly to remove the shampoo.  4. Use CHG as you would any other liquid soap. You can apply CHG directly to the skin and wash gently with a scrungie  or a clean washcloth.   5. Apply the CHG Soap to your body ONLY FROM THE NECK DOWN.  Do not use on open wounds or open sores. Avoid contact with your eyes, ears, mouth and genitals (private parts). Wash Face and genitals (private parts)  with your normal soap.  6. Wash thoroughly, paying special attention to the area where your surgery will be performed.  7. Thoroughly rinse your body with warm water from the neck down.  8. DO NOT shower/wash with your normal soap after using and rinsing off the CHG Soap.  9. Pat yourself dry with a CLEAN TOWEL.  10. Wear CLEAN PAJAMAS to bed the night before surgery, wear comfortable clothes the morning of surgery  11. Place CLEAN SHEETS on your bed the night of your first shower and DO NOT SLEEP WITH PETS.  Day of Surgery:  Do not apply any deodorants/lotions.  Please wear clean clothes to the hospital/surgery center.   Remember to brush your teeth WITH YOUR REGULAR TOOTHPASTE.  Please read over the following fact sheets that you were given.

## 2019-12-28 ENCOUNTER — Encounter (HOSPITAL_COMMUNITY)
Admission: RE | Admit: 2019-12-28 | Discharge: 2019-12-28 | Disposition: A | Discharge status: Home / Self Care | Payer: No Typology Code available for payment source | Source: Ambulatory Visit | Attending: Orthopaedic Surgery | Admitting: Orthopaedic Surgery

## 2019-12-28 ENCOUNTER — Encounter (HOSPITAL_COMMUNITY): Payer: Self-pay

## 2019-12-28 ENCOUNTER — Other Ambulatory Visit: Payer: Self-pay

## 2019-12-28 DIAGNOSIS — Z01818 Encounter for other preprocedural examination: Secondary | ICD-10-CM | POA: Insufficient documentation

## 2019-12-28 LAB — CBC
HCT: 38.3 % (ref 36.0–46.0)
Hemoglobin: 11.7 g/dL — ABNORMAL LOW (ref 12.0–15.0)
MCH: 24.5 pg — ABNORMAL LOW (ref 26.0–34.0)
MCHC: 30.5 g/dL (ref 30.0–36.0)
MCV: 80.3 fL (ref 80.0–100.0)
Platelets: 295 10*3/uL (ref 150–400)
RBC: 4.77 MIL/uL (ref 3.87–5.11)
RDW: 20 % — ABNORMAL HIGH (ref 11.5–15.5)
WBC: 6.7 10*3/uL (ref 4.0–10.5)
nRBC: 0 % (ref 0.0–0.2)

## 2019-12-28 LAB — URINALYSIS, ROUTINE W REFLEX MICROSCOPIC
Bilirubin Urine: NEGATIVE
Glucose, UA: NEGATIVE mg/dL
Ketones, ur: NEGATIVE mg/dL
Leukocytes,Ua: NEGATIVE
Nitrite: NEGATIVE
Protein, ur: NEGATIVE mg/dL
Specific Gravity, Urine: 1.019 (ref 1.005–1.030)
pH: 5 (ref 5.0–8.0)

## 2019-12-28 LAB — COMPREHENSIVE METABOLIC PANEL
ALT: 13 U/L (ref 0–44)
AST: 14 U/L — ABNORMAL LOW (ref 15–41)
Albumin: 3.6 g/dL (ref 3.5–5.0)
Alkaline Phosphatase: 61 U/L (ref 38–126)
Anion gap: 8 (ref 5–15)
BUN: 13 mg/dL (ref 6–20)
CO2: 20 mmol/L — ABNORMAL LOW (ref 22–32)
Calcium: 8.8 mg/dL — ABNORMAL LOW (ref 8.9–10.3)
Chloride: 112 mmol/L — ABNORMAL HIGH (ref 98–111)
Creatinine, Ser: 0.99 mg/dL (ref 0.44–1.00)
GFR calc Af Amer: >60 mL/min (ref >60)
GFR calc non Af Amer: >60 mL/min (ref >60)
Glucose, Bld: 101 mg/dL — ABNORMAL HIGH (ref 70–99)
Potassium: 4 mmol/L (ref 3.5–5.1)
Sodium: 140 mmol/L (ref 135–145)
Total Bilirubin: 0.5 mg/dL (ref 0.3–1.2)
Total Protein: 6.9 g/dL (ref 6.5–8.1)

## 2019-12-28 LAB — SURGICAL PCR SCREEN
MRSA, PCR: NEGATIVE
Staphylococcus aureus: POSITIVE — AB

## 2019-12-28 NOTE — Progress Notes (Signed)
PCP - [place], MD Cardiologist - pt denies  PPM/ICD - n/a Device Orders -  Rep Notified -   Chest x-ray - n/a EKG - 12/28/19 Stress Test - pt denies ECHO - pt denies Cardiac Cath - pt denies  Blood Thinner Instructions: n/a Aspirin Instructions: n/a  ERAS Protcol -yes PRE-SURGERY Ensure or G2- ensure  COVID TEST- 01/02/20  Coronavirus Screening  Have you experienced the following symptoms:  Cough yes/no: No Fever (>100.62F)  yes/no: No Runny nose yes/no: No Sore throat yes/no: No Difficulty breathing/shortness of breath  yes/no: No  Have you or a family member traveled in the last 14 days and where? yes/no: No   If the patient indicates "YES" to the above questions, their PAT will be rescheduled to limit the exposure to others and, the surgeon will be notified. THE PATIENT WILL NEED TO BE ASYMPTOMATIC FOR 14 DAYS.   If the patient is not experiencing any of these symptoms, the PAT nurse will instruct them to NOT bring anyone with them to their appointment since they may have these symptoms or traveled as well.   Please remind your patients and families that hospital visitation restrictions are in effect and the importance of the restrictions.    Anesthesia review: n/a  Patient denies shortness of breath, fever, cough and chest pain at PAT appointment   All instructions explained to the patient, with a verbal understanding of the material. Patient agrees to go over the instructions while at home for a better understanding. Patient also instructed to self quarantine after being tested for COVID-19. The opportunity to ask questions was provided.

## 2020-01-02 ENCOUNTER — Other Ambulatory Visit (HOSPITAL_COMMUNITY)
Admission: RE | Admit: 2020-01-02 | Discharge: 2020-01-02 | Disposition: A | Discharge status: Home / Self Care | Payer: No Typology Code available for payment source | Source: Ambulatory Visit | Attending: Orthopaedic Surgery | Admitting: Orthopaedic Surgery

## 2020-01-02 ENCOUNTER — Telehealth: Payer: Self-pay | Admitting: Orthopaedic Surgery

## 2020-01-02 DIAGNOSIS — Z01812 Encounter for preprocedural laboratory examination: Secondary | ICD-10-CM | POA: Diagnosis not present

## 2020-01-02 DIAGNOSIS — Z20822 Contact with and (suspected) exposure to covid-19: Secondary | ICD-10-CM | POA: Diagnosis not present

## 2020-01-02 LAB — SARS CORONAVIRUS 2 (TAT 6-24 HRS): SARS Coronavirus 2: NEGATIVE

## 2020-01-02 NOTE — H&P (Signed)
Zoe Randall is an 39 y.o. female.   Chief Complaint: Low back pain and right lower extremity radiculopathy HPI: 39 year old black female history of right L5-S1 HNP, low back pain, right lower extremity and right leg weakness comes in for preop evaluation.  States that symptoms unchanged from previous visit and she is wanting to proceed with right L5-S1 microdiscectomy as scheduled.   Past Medical History:  Diagnosis Date  . Abnormal Pap smear   . Anemia   . Chicken pox   . Frequent headaches   . Herpes     Past Surgical History:  Procedure Laterality Date  . COLPOSCOPY    . SALPINGOOPHORECTOMY    . TUBAL LIGATION Bilateral 03/28/2013   Procedure: POST PARTUM TUBAL LIGATION;  Surgeon: Geryl Rankins, MD;  Location: [facility];  Service: Gynecology;  Laterality: Bilateral;    Family History  Problem Relation Age of Onset  . Hypertension Maternal Grandmother   . Hypertension Mother   . Hypertension Father   . Cancer Neg Hx    Social History:  reports that she has never smoked. She has never used smokeless tobacco. She reports that she does not drink alcohol and does not use drugs.  Allergies: No Known Allergies  No medications prior to admission.    No results found for this or any previous visit (from the past 48 hour(s)). No results found.  Review of Systems  Constitutional: Positive for activity change.  HENT: Negative.   Respiratory: Negative.   Cardiovascular: Negative.   Gastrointestinal: Negative.   Musculoskeletal: Positive for back pain.  Neurological: Positive for numbness.  Psychiatric/Behavioral: Negative.     There were no vitals taken for this visit. Physical Exam  Constitutional: She is oriented to person, place, and time.  HENT:  Head: Normocephalic.  Eyes: Pupils are equal, round, and reactive to light.  Cardiovascular: Regular rhythm.  Respiratory: Effort normal. No respiratory distress.  Musculoskeletal:        General: Tenderness present.      Cervical back: Normal range of motion.  Neurological: She is alert and oriented to person, place, and time.  Psychiatric: Mood normal.     Assessment/Plan Right L5-S1 HNP, low back pain and right lower extremity radiculopathy.  We will proceed with right L5-S1 microdiscectomy as scheduled.  Surgical procedure along with potential recovery time discussed.  All questions answered.  Zonia Kief, PA-C 01/02/2020, 10:22 AM

## 2020-01-02 NOTE — Telephone Encounter (Signed)
Forms received from [facility]. Sent to [employer].

## 2020-01-04 NOTE — Anesthesia Preprocedure Evaluation (Addendum)
Anesthesia Evaluation  Patient identified by MRN, date of birth, ID band Patient awake    Reviewed: Allergy & Precautions, NPO status , Patient's Chart, lab work & pertinent test results  History of Anesthesia Complications Negative for: history of anesthetic complications  Airway Mallampati: II  TM Distance: >3 FB Neck ROM: Full    Dental no notable dental hx.    Pulmonary neg pulmonary ROS,    Pulmonary exam normal        Cardiovascular negative cardio ROS Normal cardiovascular exam     Neuro/Psych  Headaches, Protrusion of lumbar intervertebral disc negative psych ROS   GI/Hepatic Neg liver ROS, GERD  Controlled,  Endo/Other  BMI 38  Renal/GU negative Renal ROS  negative genitourinary   Musculoskeletal negative musculoskeletal ROS (+)   Abdominal   Peds  Hematology  (+) anemia , Hgb 11.7   Anesthesia Other Findings Day of surgery medications reviewed with patient.  Reproductive/Obstetrics negative OB ROS                            Anesthesia Physical Anesthesia Plan  ASA: II  Anesthesia Plan: General   Post-op Pain Management:    Induction: Intravenous  PONV Risk Score and Plan: 4 or greater and Treatment may vary due to age or medical condition, Ondansetron, Dexamethasone, Midazolam and Scopolamine patch - Pre-op  Airway Management Planned: Oral ETT  Additional Equipment: None  Intra-op Plan:   Post-operative Plan: Extubation in OR  Informed Consent: I have reviewed the patients History and Physical, chart, labs and discussed the procedure including the risks, benefits and alternatives for the proposed anesthesia with the patient or authorized representative who has indicated his/her understanding and acceptance.     Dental advisory given  Plan Discussed with: CRNA  Anesthesia Plan Comments:        Anesthesia Quick Evaluation

## 2020-01-05 ENCOUNTER — Other Ambulatory Visit: Payer: Self-pay

## 2020-01-05 ENCOUNTER — Observation Stay (HOSPITAL_COMMUNITY)
Admission: RE | Admit: 2020-01-05 | Discharge: 2020-01-06 | Disposition: A | Discharge status: Home / Self Care | Payer: No Typology Code available for payment source | Attending: Orthopaedic Surgery | Admitting: Orthopaedic Surgery

## 2020-01-05 ENCOUNTER — Encounter (HOSPITAL_COMMUNITY)
Admission: RE | Disposition: A | Discharge status: Home / Self Care | Payer: Self-pay | Source: Home / Self Care | Attending: Orthopaedic Surgery

## 2020-01-05 ENCOUNTER — Ambulatory Visit (HOSPITAL_COMMUNITY): Payer: No Typology Code available for payment source

## 2020-01-05 ENCOUNTER — Ambulatory Visit (HOSPITAL_COMMUNITY): Payer: No Typology Code available for payment source | Admitting: Anesthesiology

## 2020-01-05 ENCOUNTER — Encounter (HOSPITAL_COMMUNITY): Payer: Self-pay | Admitting: Orthopaedic Surgery

## 2020-01-05 DIAGNOSIS — M5117 Intervertebral disc disorders with radiculopathy, lumbosacral region: Principal | ICD-10-CM | POA: Insufficient documentation

## 2020-01-05 DIAGNOSIS — M5126 Other intervertebral disc displacement, lumbar region: Secondary | ICD-10-CM

## 2020-01-05 DIAGNOSIS — Z79899 Other long term (current) drug therapy: Secondary | ICD-10-CM | POA: Insufficient documentation

## 2020-01-05 DIAGNOSIS — Z419 Encounter for procedure for purposes other than remedying health state, unspecified: Secondary | ICD-10-CM

## 2020-01-05 HISTORY — PX: LUMBAR LAMINECTOMY/DECOMPRESSION MICRODISCECTOMY: SHX5026

## 2020-01-05 LAB — POCT PREGNANCY, URINE: Preg Test, Ur: NEGATIVE

## 2020-01-05 SURGERY — LUMBAR LAMINECTOMY/DECOMPRESSION MICRODISCECTOMY
Anesthesia: General

## 2020-01-05 MED ORDER — METHOCARBAMOL 500 MG PO TABS: 500.0000 mg | ORAL_TABLET | Freq: Four times a day (QID) | ORAL | 0 refills | Status: DC

## 2020-01-05 MED ORDER — DEXAMETHASONE SODIUM PHOSPHATE 10 MG/ML IJ SOLN
INTRAMUSCULAR | Status: DC | PRN
  Administered 2020-01-05: 10 mg via INTRAVENOUS

## 2020-01-05 MED ORDER — SODIUM CHLORIDE 0.9% FLUSH: 3.0000 mL | Freq: Two times a day (BID) | INTRAVENOUS | Status: DC

## 2020-01-05 MED ORDER — CEFAZOLIN SODIUM-DEXTROSE 2-4 GM/100ML-% IV SOLN
INTRAVENOUS | Status: AC
  Filled 2020-01-05: qty 100

## 2020-01-05 MED ORDER — PROPOFOL 10 MG/ML IV BOLUS
INTRAVENOUS | Status: DC | PRN
  Administered 2020-01-05: 200 mg via INTRAVENOUS

## 2020-01-05 MED ORDER — ACETAMINOPHEN 325 MG PO TABS
650.0000 mg | ORAL_TABLET | ORAL | Status: DC | PRN
  Administered 2020-01-05 – 2020-01-06 (×3): 650 mg via ORAL
  Filled 2020-01-05 (×3): qty 2

## 2020-01-05 MED ORDER — OXYCODONE HCL 5 MG PO TABS
5.0000 mg | ORAL_TABLET | ORAL | Status: DC | PRN
  Administered 2020-01-05 – 2020-01-06 (×4): 5 mg via ORAL
  Filled 2020-01-05 (×4): qty 1

## 2020-01-05 MED ORDER — DEXAMETHASONE SODIUM PHOSPHATE 10 MG/ML IJ SOLN
INTRAMUSCULAR | Status: AC
  Filled 2020-01-05: qty 1

## 2020-01-05 MED ORDER — CHLORHEXIDINE GLUCONATE 0.12 % MT SOLN: 15.0000 mL | Freq: Once | OROMUCOSAL | Status: AC

## 2020-01-05 MED ORDER — SUGAMMADEX SODIUM 200 MG/2ML IV SOLN
INTRAVENOUS | Status: DC | PRN
  Administered 2020-01-05: 200 mg via INTRAVENOUS

## 2020-01-05 MED ORDER — SODIUM CHLORIDE 0.9% FLUSH: 3.0000 mL | INTRAVENOUS | Status: DC | PRN

## 2020-01-05 MED ORDER — ACETAMINOPHEN 650 MG RE SUPP: 650.0000 mg | RECTAL | Status: DC | PRN

## 2020-01-05 MED ORDER — PROPOFOL 10 MG/ML IV BOLUS
INTRAVENOUS | Status: AC
  Filled 2020-01-05: qty 20

## 2020-01-05 MED ORDER — MENTHOL 3 MG MT LOZG: 1.0000 | LOZENGE | OROMUCOSAL | Status: DC | PRN

## 2020-01-05 MED ORDER — ACETAMINOPHEN 500 MG PO TABS
ORAL_TABLET | ORAL | Status: AC
  Administered 2020-01-05: 1000 mg via ORAL
  Filled 2020-01-05: qty 2

## 2020-01-05 MED ORDER — SCOPOLAMINE 1 MG/3DAYS TD PT72
MEDICATED_PATCH | TRANSDERMAL | Status: AC
  Administered 2020-01-05: 1.5 mg via TRANSDERMAL
  Filled 2020-01-05: qty 1

## 2020-01-05 MED ORDER — ONDANSETRON HCL 4 MG PO TABS: 4.0000 mg | ORAL_TABLET | Freq: Four times a day (QID) | ORAL | Status: DC | PRN

## 2020-01-05 MED ORDER — HYDROMORPHONE HCL 1 MG/ML IJ SOLN
0.5000 mg | INTRAMUSCULAR | Status: DC | PRN
  Administered 2020-01-05: 0.5 mg via INTRAVENOUS
  Filled 2020-01-05: qty 0.5

## 2020-01-05 MED ORDER — OXYCODONE-ACETAMINOPHEN 5-325 MG PO TABS: 1.0000 | ORAL_TABLET | ORAL | 0 refills | Status: DC | PRN

## 2020-01-05 MED ORDER — BUPIVACAINE HCL (PF) 0.25 % IJ SOLN
INTRAMUSCULAR | Status: AC
  Filled 2020-01-05: qty 10

## 2020-01-05 MED ORDER — FENTANYL CITRATE (PF) 100 MCG/2ML IJ SOLN
25.0000 ug | INTRAMUSCULAR | Status: DC | PRN
  Administered 2020-01-05: 25 ug via INTRAVENOUS

## 2020-01-05 MED ORDER — OXYCODONE HCL 5 MG/5ML PO SOLN: 5.0000 mg | Freq: Once | ORAL | Status: AC | PRN

## 2020-01-05 MED ORDER — MIDAZOLAM HCL 2 MG/2ML IJ SOLN
INTRAMUSCULAR | Status: AC
  Filled 2020-01-05: qty 2

## 2020-01-05 MED ORDER — ROCURONIUM BROMIDE 10 MG/ML (PF) SYRINGE
PREFILLED_SYRINGE | INTRAVENOUS | Status: DC | PRN
  Administered 2020-01-05: 50 mg via INTRAVENOUS

## 2020-01-05 MED ORDER — PHENOL 1.4 % MT LIQD: 1.0000 | OROMUCOSAL | Status: DC | PRN

## 2020-01-05 MED ORDER — FENTANYL CITRATE (PF) 100 MCG/2ML IJ SOLN
INTRAMUSCULAR | Status: AC
  Filled 2020-01-05: qty 2

## 2020-01-05 MED ORDER — 0.9 % SODIUM CHLORIDE (POUR BTL) OPTIME
TOPICAL | Status: DC | PRN
  Administered 2020-01-05: 1000 mL

## 2020-01-05 MED ORDER — FENTANYL CITRATE (PF) 250 MCG/5ML IJ SOLN
INTRAMUSCULAR | Status: AC
  Filled 2020-01-05: qty 5

## 2020-01-05 MED ORDER — FENTANYL CITRATE (PF) 250 MCG/5ML IJ SOLN
INTRAMUSCULAR | Status: DC | PRN
  Administered 2020-01-05 (×2): 50 ug via INTRAVENOUS
  Administered 2020-01-05: 100 ug via INTRAVENOUS
  Administered 2020-01-05: 50 ug via INTRAVENOUS

## 2020-01-05 MED ORDER — PROMETHAZINE HCL 25 MG/ML IJ SOLN: 6.2500 mg | INTRAMUSCULAR | Status: DC | PRN

## 2020-01-05 MED ORDER — LIDOCAINE HCL (CARDIAC) PF 100 MG/5ML IV SOSY
PREFILLED_SYRINGE | INTRAVENOUS | Status: DC | PRN
  Administered 2020-01-05: 100 mg via INTRAVENOUS

## 2020-01-05 MED ORDER — MIDAZOLAM HCL 5 MG/5ML IJ SOLN
INTRAMUSCULAR | Status: DC | PRN
  Administered 2020-01-05: 2 mg via INTRAVENOUS

## 2020-01-05 MED ORDER — FERROUS SULFATE 325 (65 FE) MG PO TABS
325.0000 mg | ORAL_TABLET | Freq: Every day | ORAL | Status: DC
  Administered 2020-01-05: 325 mg via ORAL
  Filled 2020-01-05: qty 1

## 2020-01-05 MED ORDER — ROCURONIUM BROMIDE 10 MG/ML (PF) SYRINGE
PREFILLED_SYRINGE | INTRAVENOUS | Status: AC
  Filled 2020-01-05: qty 10

## 2020-01-05 MED ORDER — ORAL CARE MOUTH RINSE: 15.0000 mL | Freq: Once | OROMUCOSAL | Status: AC

## 2020-01-05 MED ORDER — CEFAZOLIN SODIUM-DEXTROSE 2-4 GM/100ML-% IV SOLN
2.0000 g | INTRAVENOUS | Status: AC
  Administered 2020-01-05: 2 g via INTRAVENOUS

## 2020-01-05 MED ORDER — ACETAMINOPHEN 500 MG PO TABS: 1000.0000 mg | ORAL_TABLET | Freq: Once | ORAL | Status: AC

## 2020-01-05 MED ORDER — LIDOCAINE 2% (20 MG/ML) 5 ML SYRINGE
INTRAMUSCULAR | Status: AC
  Filled 2020-01-05: qty 5

## 2020-01-05 MED ORDER — METHOCARBAMOL 500 MG PO TABS
500.0000 mg | ORAL_TABLET | Freq: Four times a day (QID) | ORAL | Status: DC | PRN
  Administered 2020-01-05 – 2020-01-06 (×3): 500 mg via ORAL
  Filled 2020-01-05 (×3): qty 1

## 2020-01-05 MED ORDER — TOPIRAMATE 100 MG PO TABS
100.0000 mg | ORAL_TABLET | Freq: Every evening | ORAL | Status: DC
  Administered 2020-01-05: 100 mg via ORAL
  Filled 2020-01-05: qty 1

## 2020-01-05 MED ORDER — ONDANSETRON HCL 4 MG/2ML IJ SOLN
INTRAMUSCULAR | Status: AC
  Filled 2020-01-05: qty 2

## 2020-01-05 MED ORDER — LACTATED RINGERS IV SOLN: INTRAVENOUS | Status: DC | PRN

## 2020-01-05 MED ORDER — SCOPOLAMINE 1 MG/3DAYS TD PT72: 1.0000 | MEDICATED_PATCH | Freq: Once | TRANSDERMAL | Status: DC

## 2020-01-05 MED ORDER — BUPIVACAINE HCL (PF) 0.25 % IJ SOLN
INTRAMUSCULAR | Status: DC | PRN
  Administered 2020-01-05: 9 mL

## 2020-01-05 MED ORDER — OXYCODONE HCL 5 MG PO TABS
ORAL_TABLET | ORAL | Status: AC
  Filled 2020-01-05: qty 1

## 2020-01-05 MED ORDER — ONDANSETRON HCL 4 MG/2ML IJ SOLN
INTRAMUSCULAR | Status: DC | PRN
  Administered 2020-01-05: 4 mg via INTRAVENOUS

## 2020-01-05 MED ORDER — ONDANSETRON HCL 4 MG/2ML IJ SOLN: 4.0000 mg | Freq: Four times a day (QID) | INTRAMUSCULAR | Status: DC | PRN

## 2020-01-05 MED ORDER — OXYCODONE HCL 5 MG PO TABS
5.0000 mg | ORAL_TABLET | Freq: Once | ORAL | Status: AC | PRN
  Administered 2020-01-05: 5 mg via ORAL

## 2020-01-05 MED ORDER — CHLORHEXIDINE GLUCONATE 0.12 % MT SOLN
OROMUCOSAL | Status: AC
  Administered 2020-01-05: 15 mL via OROMUCOSAL
  Filled 2020-01-05: qty 15

## 2020-01-05 MED ORDER — SODIUM CHLORIDE 0.9 % IV SOLN: INTRAVENOUS | Status: DC

## 2020-01-05 SURGICAL SUPPLY — 48 items
APL SKNCLS STERI-STRIP NONHPOA (GAUZE/BANDAGES/DRESSINGS) ×1
BENZOIN TINCTURE PRP APPL 2/3 (GAUZE/BANDAGES/DRESSINGS) ×2 IMPLANT
BUR ROUND FLUTED 4 SOFT TCH (BURR) ×1 IMPLANT
BUR ROUND FLUTED 4MM SOFT TCH (BURR) ×1
CANISTER SUCT 3000ML PPV (MISCELLANEOUS) ×3 IMPLANT
CLOSURE STERI-STRIP 1/2X4 (GAUZE/BANDAGES/DRESSINGS) ×1
CLOSURE WOUND 1/2 X4 (GAUZE/BANDAGES/DRESSINGS) ×1
CLSR STERI-STRIP ANTIMIC 1/2X4 (GAUZE/BANDAGES/DRESSINGS) ×2 IMPLANT
COVER SURGICAL LIGHT HANDLE (MISCELLANEOUS) ×3 IMPLANT
COVER WAND RF STERILE (DRAPES) ×3 IMPLANT
DECANTER SPIKE VIAL GLASS SM (MISCELLANEOUS) ×3 IMPLANT
DRAPE HALF SHEET 40X57 (DRAPES) ×6 IMPLANT
DRAPE MICROSCOPE LEICA (MISCELLANEOUS) ×3 IMPLANT
DRAPE SURG 17X23 STRL (DRAPES) ×3 IMPLANT
DRSG MEPILEX BORDER 4X4 (GAUZE/BANDAGES/DRESSINGS) ×3 IMPLANT
DURAPREP 26ML APPLICATOR (WOUND CARE) ×3 IMPLANT
ELECT REM PT RETURN 9FT ADLT (ELECTROSURGICAL) ×3
ELECTRODE REM PT RTRN 9FT ADLT (ELECTROSURGICAL) ×1 IMPLANT
GLOVE BIOGEL PI IND STRL 8 (GLOVE) ×2 IMPLANT
GLOVE BIOGEL PI INDICATOR 8 (GLOVE) ×4
GLOVE ORTHO TXT STRL SZ7.5 (GLOVE) ×6 IMPLANT
GOWN STRL REUS W/ TWL LRG LVL3 (GOWN DISPOSABLE) ×2 IMPLANT
GOWN STRL REUS W/ TWL XL LVL3 (GOWN DISPOSABLE) ×1 IMPLANT
GOWN STRL REUS W/TWL 2XL LVL3 (GOWN DISPOSABLE) ×3 IMPLANT
GOWN STRL REUS W/TWL LRG LVL3 (GOWN DISPOSABLE) ×6
GOWN STRL REUS W/TWL XL LVL3 (GOWN DISPOSABLE) ×3
KIT BASIN OR (CUSTOM PROCEDURE TRAY) ×3 IMPLANT
KIT TURNOVER KIT B (KITS) ×3 IMPLANT
MANIFOLD NEPTUNE II (INSTRUMENTS) ×1 IMPLANT
NDL HYPO 25GX1X1/2 BEV (NEEDLE) ×1 IMPLANT
NDL SPNL 18GX3.5 QUINCKE PK (NEEDLE) ×1 IMPLANT
NEEDLE HYPO 25GX1X1/2 BEV (NEEDLE) ×3 IMPLANT
NEEDLE SPNL 18GX3.5 QUINCKE PK (NEEDLE) ×3 IMPLANT
NS IRRIG 1000ML POUR BTL (IV SOLUTION) ×3 IMPLANT
PACK LAMINECTOMY ORTHO (CUSTOM PROCEDURE TRAY) ×3 IMPLANT
PAD ARMBOARD 7.5X6 YLW CONV (MISCELLANEOUS) ×6 IMPLANT
PATTIES SURGICAL .5 X.5 (GAUZE/BANDAGES/DRESSINGS) IMPLANT
PATTIES SURGICAL .75X.75 (GAUZE/BANDAGES/DRESSINGS) IMPLANT
STRIP CLOSURE SKIN 1/2X4 (GAUZE/BANDAGES/DRESSINGS) ×1 IMPLANT
SUT VIC AB 0 CT1 27 (SUTURE)
SUT VIC AB 0 CT1 27XBRD ANBCTR (SUTURE) IMPLANT
SUT VIC AB 1 CTX 36 (SUTURE) ×3
SUT VIC AB 1 CTX36XBRD ANBCTR (SUTURE) ×1 IMPLANT
SUT VIC AB 2-0 CT1 27 (SUTURE) ×3
SUT VIC AB 2-0 CT1 TAPERPNT 27 (SUTURE) ×1 IMPLANT
SUT VIC AB 3-0 X1 27 (SUTURE) ×3 IMPLANT
TOWEL GREEN STERILE (TOWEL DISPOSABLE) ×3 IMPLANT
TOWEL GREEN STERILE FF (TOWEL DISPOSABLE) ×3 IMPLANT

## 2020-01-05 NOTE — Anesthesia Postprocedure Evaluation (Signed)
Anesthesia Post Note  Patient: Zoe Randall  Procedure(s) Performed: RIGHT L5-S1 MICRODISCECTOMY (N/A )     Patient location during evaluation: PACU Anesthesia Type: General Level of consciousness: awake and alert and oriented Pain management: pain level controlled Vital Signs Assessment: post-procedure vital signs reviewed and stable Respiratory status: spontaneous breathing, nonlabored ventilation and respiratory function stable Cardiovascular status: blood pressure returned to baseline Postop Assessment: no apparent nausea or vomiting Anesthetic complications: no   No complications documented.  Last Vitals:  Vitals:   01/05/20 1015 01/05/20 1042  BP: 116/78 117/85  Pulse: 77 72  Resp: 15 18  Temp:  36.7 C  SpO2: 100% 100%                  Kaylyn Layer

## 2020-01-05 NOTE — Evaluation (Signed)
Physical Therapy Evaluation Patient Details Name: Zoe Randall MRN: [id number] DOB: Dec 18, 1980 Today's Date: 01/05/2020   History of Present Illness  pt is a 39 y/o female with no pertinent hx, admitted with radicular pain from right L5 S1 HNP, s/p microdiscectomy.  Clinical Impression  Pt is close to baseline functioning and should be safe at home with available assist.  Completed education and gave handout. There are no further acute PT needs.  Will sign off at this time.     Follow Up Recommendations No PT follow up    Equipment Recommendations  None recommended by PT    Recommendations for Other Services       Precautions / Restrictions Precautions Precautions: Back Precaution Booklet Issued: Yes (comment)      Mobility  Bed Mobility Overal bed mobility: Needs Assistance Bed Mobility: Rolling;Sidelying to Sit;Sit to Sidelying Rolling: Min assist Sidelying to sit: Min assist     Sit to sidelying: Min assist General bed mobility comments: instructed in log roll and transition via sidelying, needing minimal assist at this point.  Transfers Overall transfer level: Needs assistance Equipment used: None Transfers: Sit to/from Stand Sit to Stand: Min guard            Ambulation/Gait Ambulation/Gait assistance: Supervision Gait Distance (Feet): 180 Feet Assistive device: None Gait Pattern/deviations: Step-through pattern   Gait velocity interpretation: 1.31 - 2.62 ft/sec, indicative of limited community ambulator General Gait Details: steady and safe  Stairs Stairs: Yes Stairs assistance: Supervision Stair Management: One rail Left;Step to pattern;Alternating pattern;Forwards Number of Stairs: 5 General stair comments: safe wth rail  Wheelchair Mobility    Modified Rankin (Stroke Patients Only)       Balance Overall balance assessment: No apparent balance deficits (not formally assessed)                                            Pertinent Vitals/Pain Pain Assessment: Faces Faces Pain Scale: Hurts little more Pain Location: incision Pain Descriptors / Indicators: Guarding Pain Intervention(s): Monitored during session    Home Living Family/patient expects to be discharged to:: Private residence Living Arrangements: Spouse/significant other Available Help at Discharge: Family;Available 24 hours/day Type of Home: House Home Access: Level entry     Home Layout: One level Home Equipment: None      Prior Function Level of Independence: Independent               Hand Dominance        Extremity/Trunk Assessment   Upper Extremity Assessment Upper Extremity Assessment: Overall WFL for tasks assessed    Lower Extremity Assessment Lower Extremity Assessment: Overall WFL for tasks assessed       Communication   Communication: No difficulties  Cognition Arousal/Alertness: Awake/alert Behavior During Therapy: WFL for tasks assessed/performed Overall Cognitive Status: Within Functional Limits for tasks assessed                                        General Comments General comments (skin integrity, edema, etc.): All education completed with pt and husband.    Exercises     Assessment/Plan    PT Assessment Patent does not need any further PT services  PT Problem List         PT Treatment Interventions  PT Goals (Current goals can be found in the Care Plan section)  Acute Rehab PT Goals PT Goal Formulation: All assessment and education complete, DC therapy    Frequency     Barriers to discharge        Co-evaluation               AM-PAC PT "6 Clicks" Mobility  Outcome Measure Help needed turning from your back to your side while in a flat bed without using bedrails?: A Little Help needed moving from lying on your back to sitting on the side of a flat bed without using bedrails?: A Little Help needed moving to and from a bed to a chair (including  a wheelchair)?: A Little Help needed standing up from a chair using your arms (e.g., wheelchair or bedside chair)?: None Help needed to walk in hospital room?: None Help needed climbing 3-5 steps with a railing? : None 6 Click Score: 21    End of Session   Activity Tolerance: Patient tolerated treatment well Patient left: in bed;with call bell/phone within reach   PT Visit Diagnosis: Other abnormalities of gait and mobility (R26.89);Pain Pain - part of body:  (back)    Time: 4696-2952 PT Time Calculation (min) (ACUTE ONLY): 25 min   Charges:   PT Evaluation $PT Eval Low Complexity: 1 Low PT Treatments $Gait Training: 8-22 mins        01/05/2020  Jacinto Halim., PT Acute Rehabilitation Services [phone]  (pager) [phone]  (office)  Eliseo Gum Ann Groeneveld 01/05/2020, 7:06 PM

## 2020-01-05 NOTE — Op Note (Signed)
Preop diagnosis: Right L5-S1 HNP with radiculopathy.  Postop diagnosis: Same  Procedure: Right L5-S1 microdiscectomy.  Surgeon: Annell Greening, MD  Assistant: Zonia Kief, PA-C medically necessary and present for the entire procedure  Anesthesia General plus Marcaine skin local.  Findings:large L5-S1 right HNP  Procedure: After standard prepping draping preoperative Ancef prophylaxis patient was prone on chest rolls careful padding positioning.  Back was prepped with DuraPrep the area squared with towels Betadine Steri-Drape and laminectomy sheet.  Needle localization with a spinal placed directly at the L5-S1 level was used for defecation.  Patient had thick adipose layer at the operative level and an extra long instruments block; with extra long blades roll required just to reach the fascia.  Difficult to palpate the midline.  Initial fascial opening was made laterally on the right side adjacent to posterior superior iliac spine which is then reapproximated with 0 Vicryl sutures.  Spinous process at L5 was palpated and the extra deep Taylor retractor was placed laterally.  Second and third x-ray were taken with a Penfield 4 at the interlaminar space confirming appropriate level.  Bone was marked with purple marker and laminotomy was performed with the bur removing chunks of ligament.  Due to the large side of the disc couple millimeters in the medial wall of the pedicle was taken foraminotomy was performed until the nerve root could be gently retracted over the top of the large disc that was protruding.  Annulus was incised and medial large fragment came out with pressure down from the D'Errico.  Extra long straight micropituitary was used to perform microdiscectomy.  Minimal disc material is present centrally and 1 spaces were removed nerve root was free and Lorette Ang could be passed anterior to the nerve root without evidence of any compression.  Irrigation saline solution.  Epidural veins were  coagulated with Exelon bipolar.  Nerve root was free no central compression dura was intact.  Epidural space was dry.  Fascia was closed with #1 Vicryl.  0 Vicryl was used to reapproximate the thick adipose layer.  Subcuticular closure Marcaine infiltration dry Dermabond on skin postop dressing and Mepilex was applied.  Instrument count needle count was correct.

## 2020-01-05 NOTE — Anesthesia Procedure Notes (Signed)
Procedure Name: Intubation Date/Time: 01/05/2020 7:36 AM Performed by: Shary Decamp, CRNA Pre-anesthesia Checklist: Patient identified, Emergency Drugs available, Suction available and Patient being monitored Patient Re-evaluated:Patient Re-evaluated prior to induction Oxygen Delivery Method: Circle system utilized Preoxygenation: Pre-oxygenation with 100% oxygen Induction Type: IV induction Ventilation: Mask ventilation without difficulty Laryngoscope Size: Miller and 2 Grade View: Grade I Tube type: Oral Tube size: 7.5 mm Number of attempts: 1 Airway Equipment and Method: Stylet and Oral airway Placement Confirmation: ETT inserted through vocal cords under direct vision,  positive ETCO2 and breath sounds checked- equal and bilateral Secured at: 22 cm Tube secured with: Tape Dental Injury: Teeth and Oropharynx as per pre-operative assessment

## 2020-01-05 NOTE — Transfer of Care (Signed)
Immediate Anesthesia Transfer of Care Note  Patient: Zoe Randall  Procedure(s) Performed: RIGHT L5-S1 MICRODISCECTOMY (N/A )  Patient Location: PACU  Anesthesia Type:General  Level of Consciousness: drowsy, patient cooperative and responds to stimulation  Airway & Oxygen Therapy: Patient Spontanous Breathing and Patient connected to face mask oxygen  Post-op Assessment: Report given to RN, Post -op Vital signs reviewed and stable and Patient moving all extremities X 4  Post vital signs: Reviewed and stable  Last Vitals:  Vitals Value Taken Time  BP 127/94 01/05/20 0930  Temp 36.6 C 01/05/20 0925  Pulse 90 01/05/20 0932  Resp 15 01/05/20 0932  SpO2 100 % 01/05/20 0932  Vitals shown include unvalidated device data.  Last Pain:  Vitals:   01/05/20 0925  TempSrc:   PainSc: Asleep         Complications: No complications documented.

## 2020-01-05 NOTE — Interval H&P Note (Signed)
History and Physical Interval Note:  01/05/2020 7:14 AM  Zoe Randall  has presented today for surgery, with the diagnosis of RIGHT L5-S1 PROTRUSION.  The various methods of treatment have been discussed with the patient and family. After consideration of risks, benefits and other options for treatment, the patient has consented to  Procedure(s): RIGHT L5-S1 MICRODISCECTOMY (N/A) as a surgical intervention.  The patient's history has been reviewed, patient examined, no change in status, stable for surgery.  I have reviewed the patient's chart and labs.  Questions were answered to the patient's satisfaction.     Eldred Manges

## 2020-01-06 ENCOUNTER — Encounter (HOSPITAL_COMMUNITY): Payer: Self-pay | Admitting: Orthopaedic Surgery

## 2020-01-06 DIAGNOSIS — M5117 Intervertebral disc disorders with radiculopathy, lumbosacral region: Secondary | ICD-10-CM | POA: Diagnosis not present

## 2020-01-06 LAB — BASIC METABOLIC PANEL
Anion gap: 7 (ref 5–15)
BUN: 9 mg/dL (ref 6–20)
CO2: 21 mmol/L — ABNORMAL LOW (ref 22–32)
Calcium: 8.4 mg/dL — ABNORMAL LOW (ref 8.9–10.3)
Chloride: 109 mmol/L (ref 98–111)
Creatinine, Ser: 0.98 mg/dL (ref 0.44–1.00)
GFR calc Af Amer: >60 mL/min (ref >60)
GFR calc non Af Amer: >60 mL/min (ref >60)
Glucose, Bld: 132 mg/dL — ABNORMAL HIGH (ref 70–99)
Potassium: 3.9 mmol/L (ref 3.5–5.1)
Sodium: 137 mmol/L (ref 135–145)

## 2020-01-06 LAB — CBC
HCT: 33.8 % — ABNORMAL LOW (ref 36.0–46.0)
Hemoglobin: 10.5 g/dL — ABNORMAL LOW (ref 12.0–15.0)
MCH: 24.9 pg — ABNORMAL LOW (ref 26.0–34.0)
MCHC: 31.1 g/dL (ref 30.0–36.0)
MCV: 80.1 fL (ref 80.0–100.0)
Platelets: 211 10*3/uL (ref 150–400)
RBC: 4.22 MIL/uL (ref 3.87–5.11)
RDW: 19.2 % — ABNORMAL HIGH (ref 11.5–15.5)
WBC: 14.4 10*3/uL — ABNORMAL HIGH (ref 4.0–10.5)
nRBC: 0 % (ref 0.0–0.2)

## 2020-01-06 NOTE — Progress Notes (Signed)
Patient is discharged from room 3C04 at this time. Alert and in stable condition. IV site d/c'd and instructions read to patient and spouse with understanding verbalized and all questions answered. Left unit via wheelchair with all belongings at side. 

## 2020-01-06 NOTE — Evaluation (Signed)
Occupational Therapy Evaluation and Discharge Patient Details Name: Zoe Randall MRN: [id number] DOB: 04-26-1981 Today's Date: 01/06/2020    History of Present Illness Pt is a 39 y/o female with no pertinent hx, admitted with radicular pain from right L5 S1 HNP, s/p microdiscectomy.   Clinical Impression   All education completed with pt and husband verbalizing understanding. Pt has excellent family support.     Follow Up Recommendations  No OT follow up    Equipment Recommendations  None recommended by OT    Recommendations for Other Services       Precautions / Restrictions Precautions Precautions: Back Precaution Booklet Issued: Yes (comment) Precaution Comments: educated in back precautions related to ADL and IADL Restrictions Weight Bearing Restrictions: No      Mobility Bed Mobility Overal bed mobility: Modified Independent                Transfers Overall transfer level: Modified independent Equipment used: None                  Balance                                           ADL either performed or assessed with clinical judgement   ADL                                         General ADL Comments: Educated pt in back precautions related to ADL and IADL, compensatory strategies for ADL and activities to avoid. Husband in room and supportive     Vision Patient Visual Report: No change from baseline       Perception     Praxis      Pertinent Vitals/Pain Pain Assessment: Faces Faces Pain Scale: Hurts little more Pain Location: incision Pain Descriptors / Indicators: Guarding Pain Intervention(s): Monitored during session;Premedicated before session;Repositioned     Hand Dominance Right   Extremity/Trunk Assessment Upper Extremity Assessment Upper Extremity Assessment: Overall WFL for tasks assessed   Lower Extremity Assessment Lower Extremity Assessment: Defer to PT evaluation        Communication Communication Communication: No difficulties   Cognition Arousal/Alertness: Awake/alert Behavior During Therapy: WFL for tasks assessed/performed Overall Cognitive Status: Within Functional Limits for tasks assessed                                     General Comments       Exercises     Shoulder Instructions      Home Living Family/patient expects to be discharged to:: Private residence Living Arrangements: Spouse/significant other;Children Available Help at Discharge: Family;Available 24 hours/day Type of Home: House Home Access: Level entry     Home Layout: One level     Bathroom Shower/Tub: Chief Strategy Officer: Standard     Home Equipment: None          Prior Functioning/Environment Level of Independence: Independent                 OT Problem List:        OT Treatment/Interventions:      OT Goals(Current goals can be found in the care plan section) Acute Rehab OT Goals  Patient Stated Goal: pain free  OT Frequency:     Barriers to D/C:            Co-evaluation              AM-PAC OT "6 Clicks" Daily Activity     Outcome Measure Help from another person eating meals?: None Help from another person taking care of personal grooming?: None Help from another person toileting, which includes using toliet, bedpan, or urinal?: None Help from another person bathing (including washing, rinsing, drying)?: None Help from another person to put on and taking off regular upper body clothing?: None Help from another person to put on and taking off regular lower body clothing?: None 6 Click Score: 24   End of Session    Activity Tolerance:   Patient left:    OT Visit Diagnosis: Pain                Time: 1610-9604 OT Time Calculation (min): 14 min Charges:  OT General Charges $OT Visit: 1 Visit OT Evaluation $OT Eval Low Complexity: 1 Low  Nestor Lewandowsky, OTR/L Acute Rehabilitation  Services Pager: [phone] Office: [phone]  Malka So 01/06/2020, 10:04 AM

## 2020-01-06 NOTE — Progress Notes (Signed)
Patient doing well. No complaints.  Pain well controlled. Dressing c/d/i Cleared PT last night. D/c home this morning.

## 2020-01-08 NOTE — Discharge Summary (Signed)
Patient ID: Zoe Randall MRN: [id number] DOB/AGE: January 26, 1981 39 y.o.  Admit date: 01/05/2020 Discharge date: 01/06/2020 Admission Diagnoses:  Active Problems:   Protrusion of lumbar intervertebral disc   Elective surgery   HNP (herniated nucleus pulposus), lumbar   Discharge Diagnoses:  Active Problems:   Protrusion of lumbar intervertebral disc   Elective surgery   HNP (herniated nucleus pulposus), lumbar  status post Procedure(s): RIGHT L5-S1 MICRODISCECTOMY  Past Medical History:  Diagnosis Date  . Abnormal Pap smear   . Anemia   . Chicken pox   . Frequent headaches   . Herpes     Surgeries: Procedure(s): RIGHT L5-S1 MICRODISCECTOMY on 01/05/2020   Consultants:   Discharged Condition: Improved  Hospital Course: Zoe Randall is an 39 y.o. female who was admitted 01/05/2020 for operative treatment of right L5-S1 HNP. Patient failed conservative treatments (please see the history and physical for the specifics) and had severe unremitting pain that affects sleep, daily activities and work/hobbies. After pre-op clearance, the patient was taken to the operating room on 01/05/2020 and underwent  Procedure(s): RIGHT L5-S1 MICRODISCECTOMY.    Patient was given perioperative antibiotics:  Anti-infectives (From admission, onward)   Start     Dose/Rate Route Frequency Ordered Stop   01/05/20 0600  ceFAZolin (ANCEF) IVPB 2g/100 mL premix        2 g 200 mL/hr over 30 Minutes Intravenous On call to O.R. 01/05/20 0543 01/05/20 0730   01/05/20 0548  ceFAZolin (ANCEF) 2-4 GM/100ML-% IVPB       Note to Pharmacy: Kathrene Bongo   : cabinet override      01/05/20 0548 01/05/20 0756       Patient was given sequential compression devices and early ambulation to prevent DVT.   Patient benefited maximally from hospital stay and there were no complications. At the time of discharge, the patient was urinating/moving their bowels without difficulty, tolerating a regular diet, pain  is controlled with oral pain medications and they have been cleared by PT/OT.   Recent vital signs: No data found.   Recent laboratory studies:  Recent Labs    01/06/20 0659  WBC 14.4*  HGB 10.5*  HCT 33.8*  PLT 211  NA 137  K 3.9  CL 109  CO2 21*  BUN 9  CREATININE 0.98  GLUCOSE 132*  CALCIUM 8.4*     Discharge Medications:   Allergies as of 01/06/2020   No Known Allergies     Medication List    STOP taking these medications   ibuprofen 200 MG tablet Commonly known as: ADVIL   SUMAtriptan 50 MG tablet Commonly known as: Imitrex     TAKE these medications   ferrous sulfate 325 (65 FE) MG tablet Take 1 tablet (325 mg total) by mouth daily.   methocarbamol 500 MG tablet Commonly known as: Robaxin Take 1 tablet (500 mg total) by mouth 4 (four) times daily.   oxyCODONE-acetaminophen 5-325 MG tablet Commonly known as: PERCOCET/ROXICET Take 1-2 tablets by mouth every 4 (four) hours as needed for severe pain.   topiramate 100 MG tablet Commonly known as: TOPAMAX Take 1 tablet (100 mg total) by mouth daily. What changed: when to take this       Diagnostic Studies: DG Lumbar Spine 2-3 Views  Result Date: 01/05/2020 CLINICAL DATA:  L5-S1 micro discectomy. EXAM: LUMBAR SPINE - 2-3 VIEW COMPARISON:  11/03/2019 FINDINGS: Initial film shows a needle between the spinous processes of L5 and the sacrum directed at the  L5-S1 level. IMPRESSION: L5-S1 localized. Electronically Signed   By: Nelson Chimes M.D.   On: 01/05/2020 14:07    Discharge Instructions    Call MD / Call 911   Complete by: As directed    If you experience chest pain or shortness of breath, CALL 911 and be transported to the hospital emergency room.  If you develope a fever above 101.5 F, pus (white drainage) or increased drainage or redness at the wound, or calf pain, call your surgeon's office.   Constipation Prevention   Complete by: As directed    Drink plenty of fluids.  Prune juice may be  helpful.  You may use a stool softener, such as Colace (over the counter) 100 mg twice a day.  Use MiraLax (over the counter) for constipation as needed.   Driving restrictions   Complete by: As directed    No driving while taking narcotic pain meds.   Increase activity slowly as tolerated   Complete by: As directed        Follow-up Information    Schedule an appointment as soon as possible for a visit with Marybelle Killings, MD.   Specialty: Orthopedic Surgery Why: need rov one week postop Contact information: [place] [address] [phone]               Discharge Plan:  discharge to home  Disposition:     Signed: Benjiman Core  01/08/2020, 2:41 PM

## 2020-01-12 ENCOUNTER — Inpatient Hospital Stay: Payer: 59 | Admitting: Orthopaedic Surgery

## 2020-01-16 ENCOUNTER — Ambulatory Visit (INDEPENDENT_AMBULATORY_CARE_PROVIDER_SITE_OTHER): Payer: No Typology Code available for payment source | Admitting: Orthopaedic Surgery

## 2020-01-16 ENCOUNTER — Encounter: Payer: Self-pay | Admitting: Orthopaedic Surgery

## 2020-01-16 DIAGNOSIS — Z9889 Other specified postprocedural states: Secondary | ICD-10-CM | POA: Insufficient documentation

## 2020-01-16 NOTE — Progress Notes (Signed)
   Post-Op Visit Note   Patient: Zoe Randall           Date of Birth: 1980/10/25           MRN: [id number] Visit Date: 01/16/2020 PCP: [place], Betty G, MD   Assessment & Plan: Post lumbar microdiscectomy.  Chief Complaint:  Chief Complaint  Patient presents with  . Lower Back - Routine Post Op    01/05/2020 Right L5-S1 microdiscectomy   Visit Diagnoses:  1. S/P lumbar microdiscectomy     Plan: Patient rates her pain an 0 she already has FMLA papers no work x4 weeks office follow-up 4 weeks and we can discuss work resumption at that time.  Incision looks good she had subcuticular closure with Dermabond.  Follow-Up Instructions: No follow-ups on file.   Orders:  No orders of the defined types were placed in this encounter.  No orders of the defined types were placed in this encounter.   Imaging: No results found.  PMFS History: Patient Active Problem List   Diagnosis Date Noted  . S/P lumbar microdiscectomy 01/16/2020  . HNP (herniated nucleus pulposus), lumbar 01/05/2020  . Elective surgery   . Protrusion of lumbar intervertebral disc 11/15/2019  . Prediabetes 01/09/2019  . Iron deficiency anemia 05/04/2018  . Class 2 obesity with body mass index (BMI) of 38.0 to 38.9 in adult 10/19/2016  . GERD (gastroesophageal reflux disease) 10/19/2016  . Migraine headache with aura 10/19/2016   Past Medical History:  Diagnosis Date  . Abnormal Pap smear   . Anemia   . Chicken pox   . Frequent headaches   . Herpes     Family History  Problem Relation Age of Onset  . Hypertension Maternal Grandmother   . Hypertension Mother   . Hypertension Father   . Cancer Neg Hx     Past Surgical History:  Procedure Laterality Date  . COLPOSCOPY    . LUMBAR LAMINECTOMY/DECOMPRESSION MICRODISCECTOMY N/A 01/05/2020   Procedure: RIGHT L5-S1 MICRODISCECTOMY;  Surgeon: Eldred Manges, MD;  Location: [facility] OR;  Service: Orthopedics;  Laterality: N/A;  . SALPINGOOPHORECTOMY    . TUBAL  LIGATION Bilateral 03/28/2013   Procedure: POST PARTUM TUBAL LIGATION;  Surgeon: Geryl Rankins, MD;  Location: [facility] ORS;  Service: Gynecology;  Laterality: Bilateral;   Social History   Occupational History  . Not on file  Tobacco Use  . Smoking status: Never Smoker  . Smokeless tobacco: Never Used  Substance and Sexual Activity  . Alcohol use: No  . Drug use: No  . Sexual activity: Not on file

## 2020-01-31 ENCOUNTER — Telehealth: Payer: Self-pay | Admitting: Family Medicine

## 2020-01-31 NOTE — Telephone Encounter (Signed)
Pt is asking, if any medication can be called in for an uti? Pt just had surgery and can't drive. Pt requested a message be sent to you. Thanks

## 2020-01-31 NOTE — Telephone Encounter (Signed)
Spoke with pt, she is having cloudy urine & bad smell. Pt can have someone bring her on Friday. Appt scheduled for 10am on Friday.

## 2020-02-02 ENCOUNTER — Encounter: Payer: Self-pay | Admitting: Family Medicine

## 2020-02-02 ENCOUNTER — Ambulatory Visit (INDEPENDENT_AMBULATORY_CARE_PROVIDER_SITE_OTHER): Payer: No Typology Code available for payment source | Admitting: Family Medicine

## 2020-02-02 ENCOUNTER — Other Ambulatory Visit: Payer: Self-pay

## 2020-02-02 VITALS — BP 122/70 | HR 96 | Resp 12 | Ht 64.0 in | Wt 223.0 lb

## 2020-02-02 DIAGNOSIS — M549 Dorsalgia, unspecified: Secondary | ICD-10-CM

## 2020-02-02 DIAGNOSIS — R739 Hyperglycemia, unspecified: Secondary | ICD-10-CM | POA: Diagnosis not present

## 2020-02-02 DIAGNOSIS — R8290 Unspecified abnormal findings in urine: Secondary | ICD-10-CM | POA: Diagnosis not present

## 2020-02-02 DIAGNOSIS — Z9889 Other specified postprocedural states: Secondary | ICD-10-CM

## 2020-02-02 LAB — POCT URINALYSIS DIPSTICK
Bilirubin, UA: NEGATIVE
Blood, UA: NEGATIVE
Glucose, UA: NEGATIVE
Ketones, UA: NEGATIVE
Nitrite, UA: NEGATIVE
Protein, UA: POSITIVE — AB
Urobilinogen, UA: 0.2 E.U./dL
pH, UA: 5.5 (ref 5.0–8.0)

## 2020-02-02 LAB — POCT GLYCOSYLATED HEMOGLOBIN (HGB A1C): Hemoglobin A1C: 5.5 % (ref 4.0–5.6)

## 2020-02-02 NOTE — Progress Notes (Signed)
Chief Complaint  Patient presents with  . Urinary Tract Infection   HPI: [name] is a 39 y.o. female, who is here today complaining of 3-4 weeks of cloudy and odorous urine. She noted problem around the time she had lumbar spine surgery, 01/05/20 Negative for dysuria, gross hematuria, increased frequency, urgency, or difficulty starting urination. Negative for vaginal bleeding or discharge.  No fever, chills, unusual fatigue, abdominal pain, nausea, vomiting, changes in bowel habits. She has not used OTC medication. She did a UTI complicated and positive for UTI.  Symptoms are stable.  Recent labs with elevated glucose at 132 on 01/06/20. Negative for polydipsia,polyuria, or polyphagia.  Review of Systems  Constitutional: Positive for activity change (s/p surgery). Negative for appetite change.  Gastrointestinal:       Negative for changes in bowel habits.  Endocrine: Negative for cold intolerance and heat intolerance.  Genitourinary: Negative for decreased urine volume, flank pain and hematuria.  Musculoskeletal: Negative for gait problem and myalgias.  Skin: Negative for pallor and rash.  Rest see pertinent positives and negatives per HPI.  Current Outpatient Medications on File Prior to Visit  Medication Sig Dispense Refill  . ferrous sulfate 325 (65 FE) MG tablet Take 1 tablet (325 mg total) by mouth daily. 90 tablet 3  . methocarbamol (ROBAXIN) 500 MG tablet Take 1 tablet (500 mg total) by mouth 4 (four) times daily. 50 tablet 0  . oxyCODONE-acetaminophen (PERCOCET/ROXICET) 5-325 MG tablet Take 1-2 tablets by mouth every 4 (four) hours as needed for severe pain. 50 tablet 0  . topiramate (TOPAMAX) 100 MG tablet Take 1 tablet (100 mg total) by mouth daily. (Patient taking differently: Take 100 mg by mouth every evening. ) 90 tablet 1   No current facility-administered medications on file prior to visit.   Past Medical History:  Diagnosis Date  . Abnormal  Pap smear   . Anemia   . Chicken pox   . Frequent headaches   . Herpes    No Known Allergies  Social History   Socioeconomic History  . Marital status: Married    Spouse name: Not on file  . Number of children: Not on file  . Years of education: Not on file  . Highest education level: Not on file  Occupational History  . Not on file  Tobacco Use  . Smoking status: Never Smoker  . Smokeless tobacco: Never Used  Substance and Sexual Activity  . Alcohol use: No  . Drug use: No  . Sexual activity: Not on file  Other Topics Concern  . Not on file  Social History Narrative  . Not on file   Social Determinants of Health   Financial Resource Strain:   . Difficulty of Paying Living Expenses:   Food Insecurity:   . Worried About Programme researcher, broadcasting/film/video in the Last Year:   . Barista in the Last Year:   Transportation Needs:   . Freight forwarder (Medical):   Marland Kitchen Lack of Transportation (Non-Medical):   Physical Activity:   . Days of Exercise per Week:   . Minutes of Exercise per Session:   Stress:   . Feeling of Stress :   Social Connections:   . Frequency of Communication with Friends and Family:   . Frequency of Social Gatherings with Friends and Family:   . Attends Religious Services:   . Active Member of Clubs or Organizations:   . Attends Banker Meetings:   .  Marital Status:    Vitals:   02/02/20 1000 02/02/20 1055  BP: 122/70   Pulse: (!) 103 96  Resp: 12   SpO2: 98%    Body mass index is 38.28 kg/m.  Physical Exam Constitutional:      General: She is not in acute distress.    Appearance: She is well-developed and well-groomed.  HENT:     Head: Normocephalic and atraumatic.     Mouth/Throat:     Mouth: Mucous membranes are dry.     Pharynx: Oropharynx is clear.  Eyes:     Conjunctiva/sclera: Conjunctivae normal.  Cardiovascular:     Rate and Rhythm: Normal rate and regular rhythm.  Pulmonary:     Effort: Pulmonary effort is  normal. No respiratory distress.     Breath sounds: Normal breath sounds.  Abdominal:     Palpations: Abdomen is soft. There is no mass.     Tenderness: There is no abdominal tenderness.  Musculoskeletal:     Lumbar back: No tenderness or bony tenderness.       Back:     Comments: Antalgic gait.  Skin:    General: Skin is warm.     Findings: No erythema.  Neurological:     Mental Status: She is alert and oriented to person, place, and time.  Psychiatric:        Mood and Affect: Mood is anxious.        Speech: Speech normal.    ASSESSMENT AND PLAN:  Ms.Delise was seen today for urinary tract infection.  Diagnoses and all orders for this visit: Orders Placed This Encounter  Procedures  . Culture, Urine  . POC HgB A1c  . POCT urinalysis dipstick   Lab Results  Component Value Date   HGBA1C 5.5 02/02/2020    Cloudy urine We discussed possible etiologies. Urine dipstick + protein and small amount of leuk. Hx does not suggest a serious infectious process. Increasing fluid intake may help. We will follow Ucx and give recommendations accordingly.  Elevated blood sugar HgA1C in normal range. Healthy life style for primary prevention of diabetes.  Back pain with history of spinal surgery Back pain has improved. Wound is healing well.   Return if symptoms worsen or fail to improve.   Harumi Yamin G. [place], MD  [facility]. [place] office.  A few things to remember from today's visit:  Elevated blood sugar - Plan: POC HgB A1c  Cloudy urine  Back pain with history of spinal surgery  History doe snot suggest infection. Increase fluid intake. We will follow urine test and culture results and will give recommendations accordingly.   Please be sure medication list is accurate. If a new problem present, please set up appointment sooner than planned today.

## 2020-02-02 NOTE — Patient Instructions (Addendum)
A few things to remember from today's visit:   Elevated blood sugar - Plan: POC HgB A1c  Cloudy urine  Back pain with history of spinal surgery  History doe snot suggest infection. Increase fluid intake. We will follow urine test and culture results and will give recommendations accordingly.   Please be sure medication list is accurate. If a new problem present, please set up appointment sooner than planned today.

## 2020-02-03 LAB — URINE CULTURE
MICRO NUMBER:: 10715316
SPECIMEN QUALITY:: ADEQUATE

## 2020-02-13 ENCOUNTER — Encounter: Payer: Self-pay | Admitting: Orthopaedic Surgery

## 2020-02-13 ENCOUNTER — Ambulatory Visit (INDEPENDENT_AMBULATORY_CARE_PROVIDER_SITE_OTHER): Payer: No Typology Code available for payment source | Admitting: Orthopaedic Surgery

## 2020-02-13 VITALS — Ht 64.0 in | Wt 223.0 lb

## 2020-02-13 DIAGNOSIS — Z9889 Other specified postprocedural states: Secondary | ICD-10-CM

## 2020-02-13 NOTE — Progress Notes (Signed)
   Post-Op Visit Note   Patient: ADAJA Randall           Date of Birth: [birth year]           MRN: [id number] Visit Date: 02/13/2020 PCP: [place], Zoe Randall   Assessment & Plan: Post right L5-S1 microdiscectomy.  Incision looks good.  She had recent trip to her PCP but culture did not meet diagnosis for UTI the symptoms have improved.  Chief Complaint:  Chief Complaint  Patient presents with  . Lower Back - Follow-up    01/05/2020 Right L5-S1 Microdiscectomy   Visit Diagnoses:  1. S/P lumbar microdiscectomy     Plan: Post right L5-S1 microdiscectomy.  She states she is ready to resume work on Monday, 02/19/2020 work slip given.  She has little tingling in her leg off and on she can gradually work on increasing her activity level to help with her A1c which has been slightly elevated discussed with her by her PCP.  She is happy with the surgical result.  She can follow with me on an as-needed basis.  Follow-Up Instructions: No follow-ups on file.   Orders:  No orders of the defined types were placed in this encounter.  No orders of the defined types were placed in this encounter.   Imaging: No results found.  PMFS History: Patient Active Problem List   Diagnosis Date Noted  . S/P lumbar microdiscectomy 01/16/2020  . Elective surgery   . Prediabetes 01/09/2019  . Iron deficiency anemia 05/04/2018  . Class 2 obesity with body mass index (BMI) of 38.0 to 38.9 in adult 10/19/2016  . GERD (gastroesophageal reflux disease) 10/19/2016  . Migraine headache with aura 10/19/2016   Past Medical History:  Diagnosis Date  . Abnormal Pap smear   . Anemia   . Chicken pox   . Frequent headaches   . Herpes     Family History  Problem Relation Age of Onset  . Hypertension Maternal Grandmother   . Hypertension Mother   . Hypertension Father   . Cancer Neg Hx     Past Surgical History:  Procedure Laterality Date  . COLPOSCOPY    . LUMBAR LAMINECTOMY/DECOMPRESSION  MICRODISCECTOMY N/A 01/05/2020   Procedure: RIGHT L5-S1 MICRODISCECTOMY;  Surgeon: Zoe Manges, Randall;  Location: [facility] OR;  Service: Orthopedics;  Laterality: N/A;  . SALPINGOOPHORECTOMY    . TUBAL LIGATION Bilateral 03/28/2013   Procedure: POST PARTUM TUBAL LIGATION;  Surgeon: Geryl Rankins, Randall;  Location: [facility] ORS;  Service: Gynecology;  Laterality: Bilateral;   Social History   Occupational History  . Not on file  Tobacco Use  . Smoking status: Never Smoker  . Smokeless tobacco: Never Used  Substance and Sexual Activity  . Alcohol use: No  . Drug use: No  . Sexual activity: Not on file

## 2020-06-02 ENCOUNTER — Other Ambulatory Visit: Payer: Self-pay | Admitting: Family Medicine

## 2020-07-18 IMAGING — MR MR LUMBAR SPINE W/O CM
4 of 5 series · 25 of 48 positions shown · non-contrast
Comparison: None.

CLINICAL DATA: Low back pain with radiculopathy. Right leg pain and
numbness

EXAM:
MRI LUMBAR SPINE WITHOUT CONTRAST
TECHNIQUE: Multiplanar, multisequence MR imaging of the lumbar spine was
performed. No intravenous contrast was administered.

[Series 2: T2 · sagittal · 4.0mm · 0.55mm/px · 7 of 16 slices shown (1 of 2)]
[im 1/16]
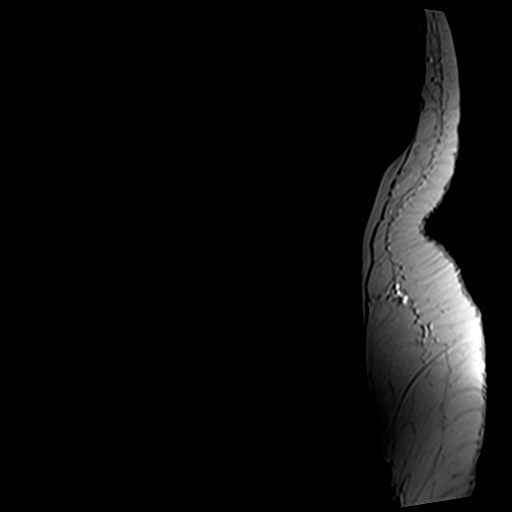
[im 3/16]
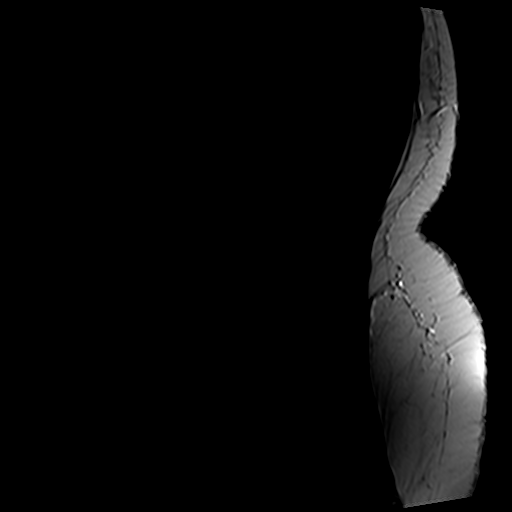
[im 6/16]
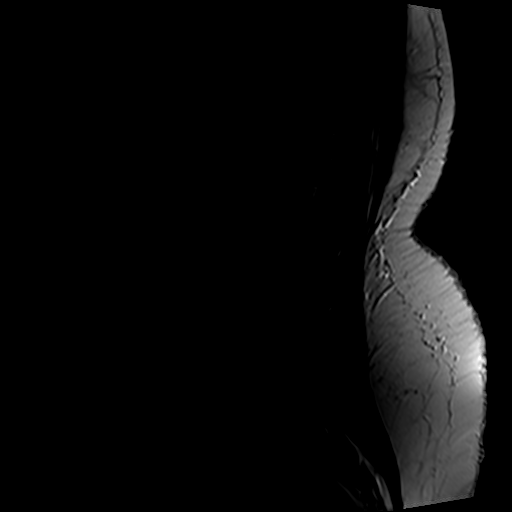
[im 8/16]
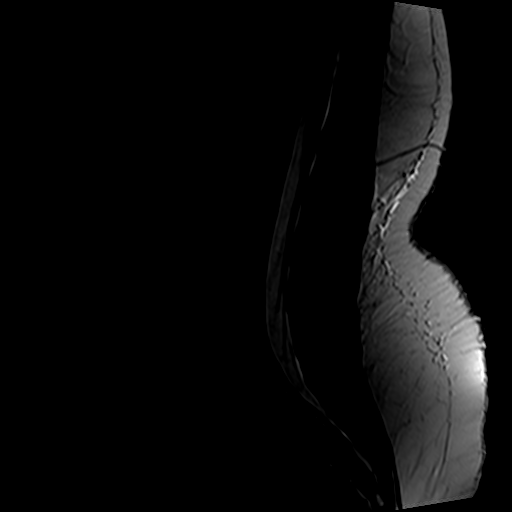
[im 11/16]
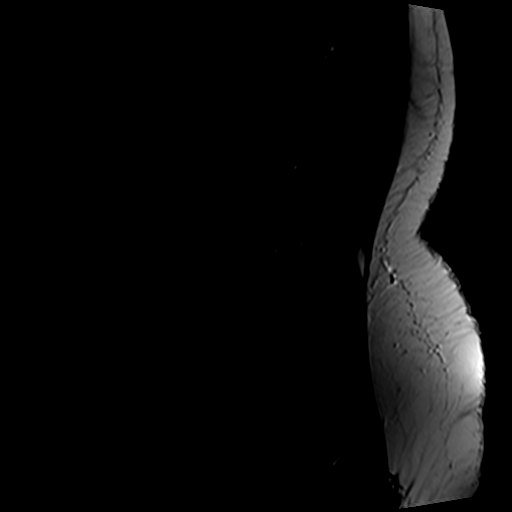
[im 13/16]
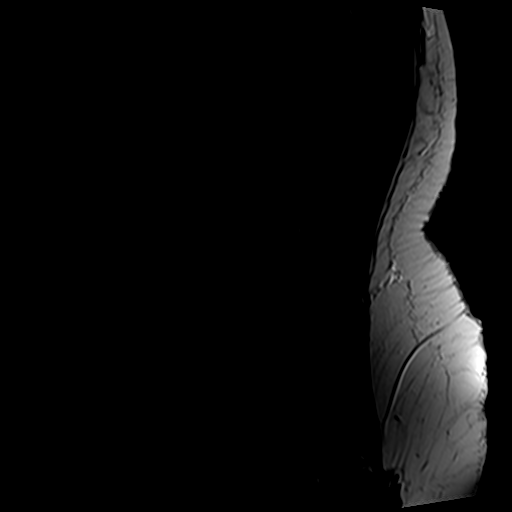
[im 16/16]
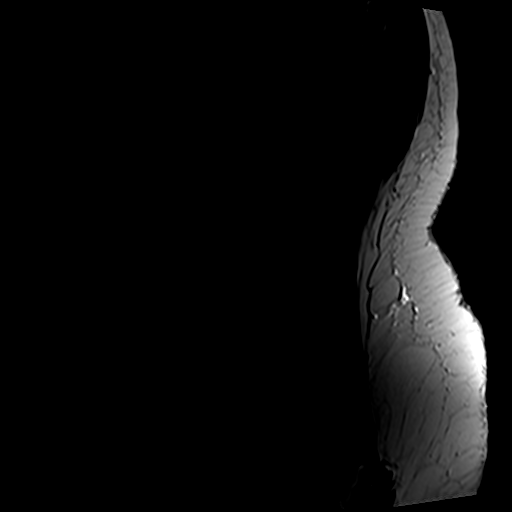

[Series 4: T1 · sagittal · 4.0mm · 0.55mm/px · 6 of 16 slices shown (1 of 2)]
[im 1/16]
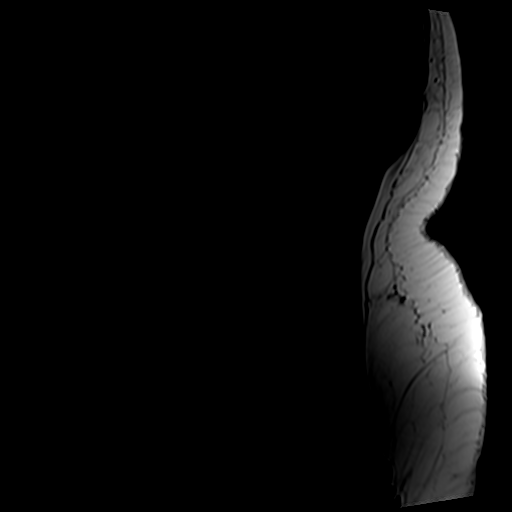
[im 4/16]
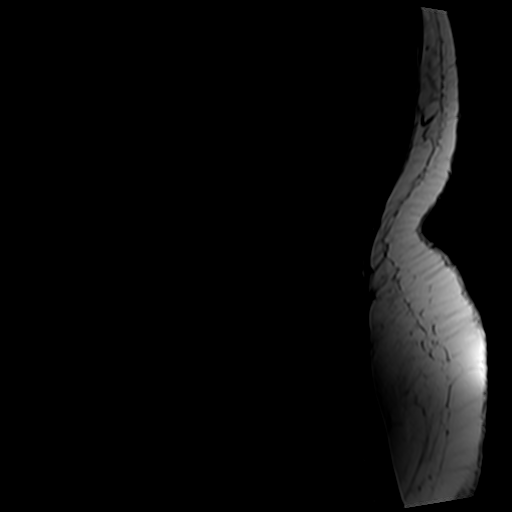
[im 7/16]
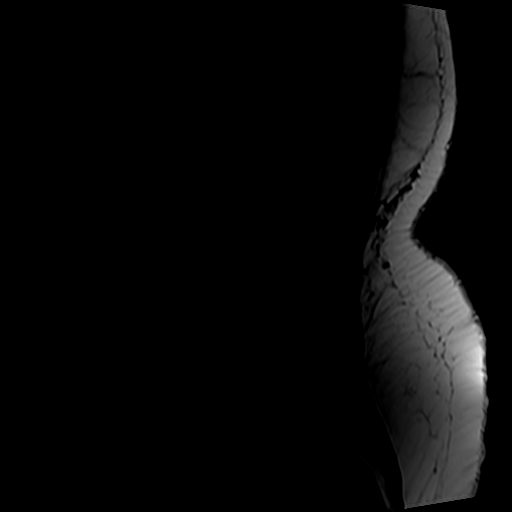
[im 10/16]
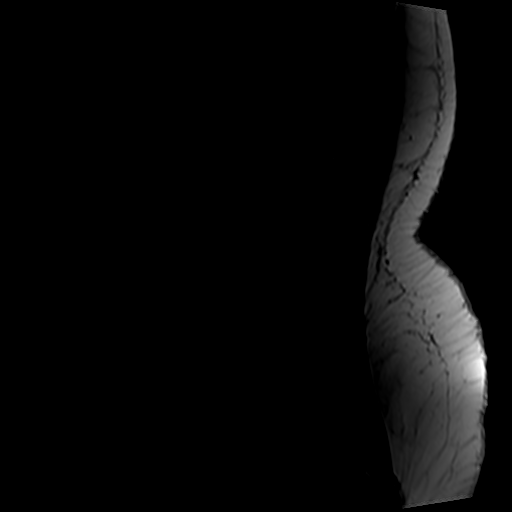
[im 13/16]
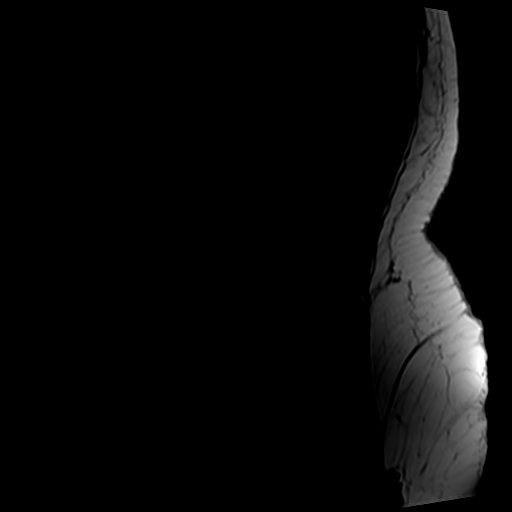
[im 16/16]
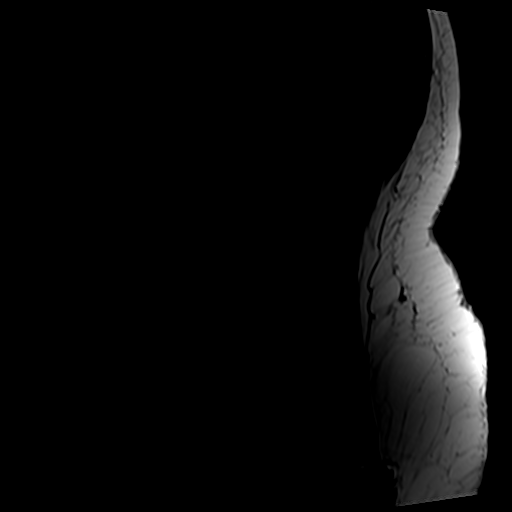

[Series 5: T2 · axial · 4.0mm · 0.70mm/px · z∈[-102,+91]mm · 8 of 36 slices shown (2 of 2)]
[im 1/36]
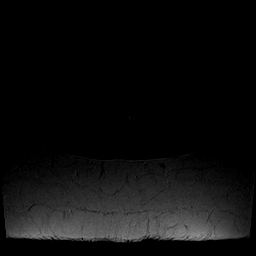
[im 6/36]
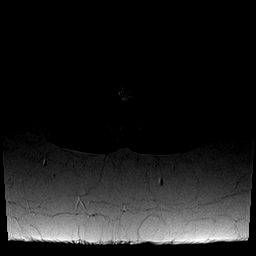
[im 11/36]
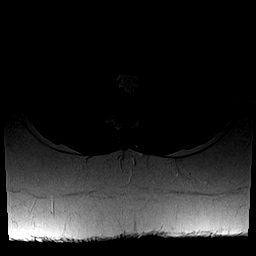
[im 17/36]
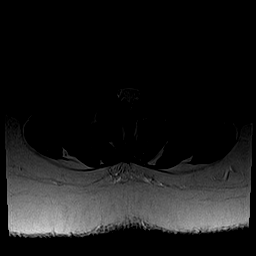
[im 19/36]
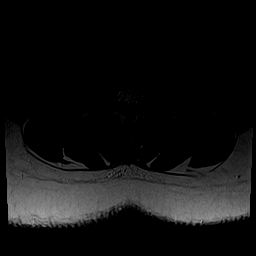
[im 25/36]
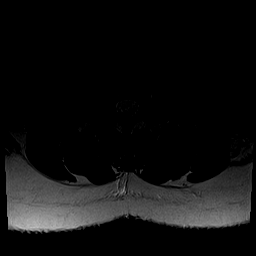
[im 30/36]
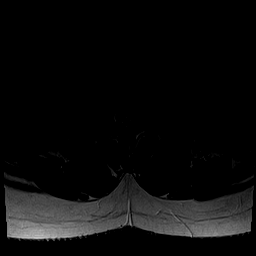
[im 36/36]
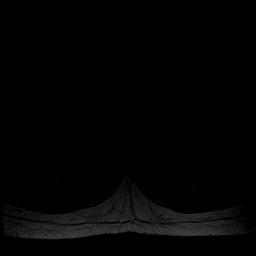

[Series 6: T1 · axial · 4.0mm · 0.35mm/px · z∈[-102,+59]mm · 4 of 36 slices shown (2 of 2)]
[im 1/36]
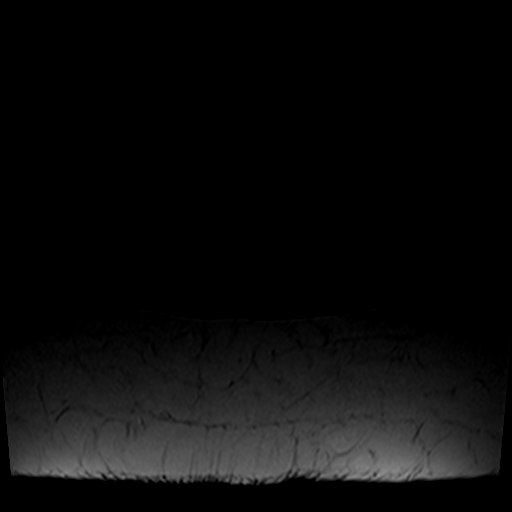
[im 6/36]
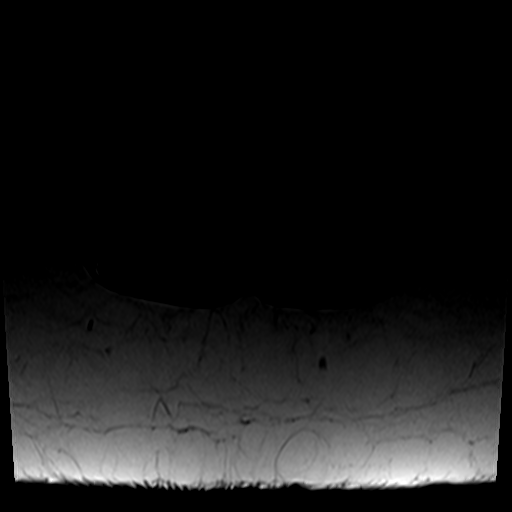
[im 19/36]
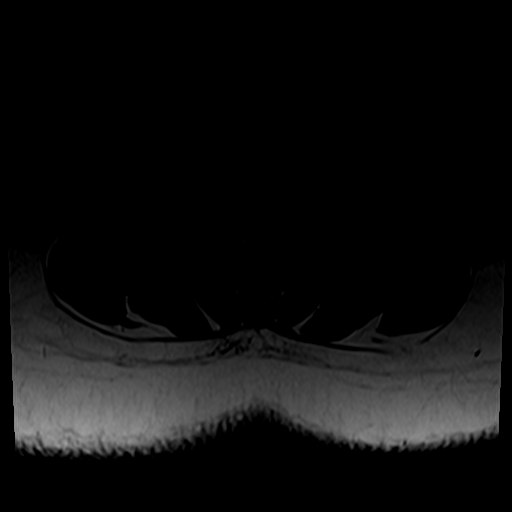
[im 30/36]
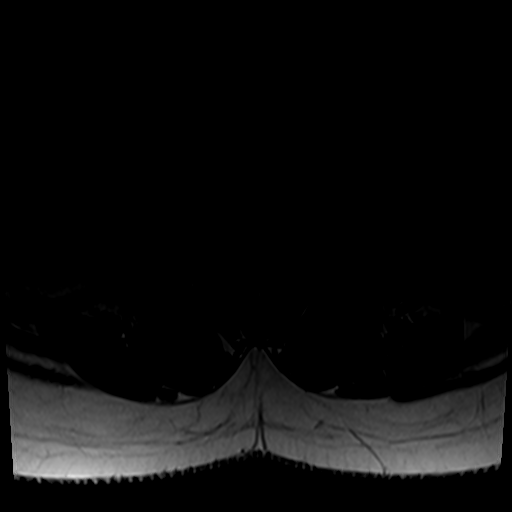

[25 of 48 positions shown; findings below may reference images not displayed]

FINDINGS: Segmentation:  Normal

Alignment:  Normal

Vertebrae: Negative for fracture or mass. Discogenic changes at
L5-S1 with sclerotic and fatty changes.

Conus medullaris and cauda equina: Conus extends to the L1-2 level.
Conus and cauda equina appear normal.

Paraspinal and other soft tissues: Negative for paraspinous mass or
adenopathy

Disc levels:

L1-2: Negative

L2-3: Negative

L3-4: Negative

L4-5: Normal disc.  Mild facet degeneration.  Negative for stenosis

L5-S1: Moderately large disc protrusion on the right. Probable
extruded disc fragment compressing the right S1 nerve root.
IMPRESSION: Moderately large disc protrusion on the right L5-S1 with right S1
nerve root impingement

## 2020-11-11 ENCOUNTER — Encounter: Payer: No Typology Code available for payment source | Admitting: Family Medicine

## 2020-11-24 ENCOUNTER — Other Ambulatory Visit: Payer: Self-pay | Admitting: Family Medicine

## 2020-12-18 LAB — HM PAP SMEAR: HM Pap smear: NORMAL

## 2021-03-04 ENCOUNTER — Other Ambulatory Visit: Payer: Self-pay | Admitting: Family Medicine

## 2021-03-05 ENCOUNTER — Ambulatory Visit: Payer: No Typology Code available for payment source | Admitting: Orthopaedic Surgery

## 2021-04-01 ENCOUNTER — Ambulatory Visit: Payer: No Typology Code available for payment source | Admitting: Orthopaedic Surgery

## 2021-04-09 ENCOUNTER — Ambulatory Visit: Payer: No Typology Code available for payment source | Admitting: Orthopaedic Surgery

## 2021-04-29 ENCOUNTER — Other Ambulatory Visit: Payer: Self-pay

## 2021-04-29 ENCOUNTER — Encounter: Payer: Self-pay | Admitting: Family Medicine

## 2021-04-29 ENCOUNTER — Ambulatory Visit (INDEPENDENT_AMBULATORY_CARE_PROVIDER_SITE_OTHER): Payer: No Typology Code available for payment source | Admitting: Family Medicine

## 2021-04-29 VITALS — BP 120/70 | HR 77 | Temp 98.6°F | Resp 12 | Ht 64.0 in | Wt 206.0 lb

## 2021-04-29 DIAGNOSIS — Z0000 Encounter for general adult medical examination without abnormal findings: Secondary | ICD-10-CM | POA: Diagnosis not present

## 2021-04-29 DIAGNOSIS — Z1329 Encounter for screening for other suspected endocrine disorder: Secondary | ICD-10-CM

## 2021-04-29 DIAGNOSIS — D500 Iron deficiency anemia secondary to blood loss (chronic): Secondary | ICD-10-CM

## 2021-04-29 DIAGNOSIS — Z23 Encounter for immunization: Secondary | ICD-10-CM

## 2021-04-29 DIAGNOSIS — Z13228 Encounter for screening for other metabolic disorders: Secondary | ICD-10-CM

## 2021-04-29 DIAGNOSIS — Z13220 Encounter for screening for lipoid disorders: Secondary | ICD-10-CM | POA: Diagnosis not present

## 2021-04-29 DIAGNOSIS — Z1159 Encounter for screening for other viral diseases: Secondary | ICD-10-CM

## 2021-04-29 DIAGNOSIS — Z130 Encounter for screening for diseases of the blood and blood-forming organs and certain disorders involving the immune mechanism: Secondary | ICD-10-CM | POA: Diagnosis not present

## 2021-04-29 LAB — LIPID PANEL
Cholesterol: 148 mg/dL (ref 0–200)
HDL: 61.9 mg/dL (ref >39.00)
LDL Cholesterol: 76 mg/dL (ref 0–99)
NonHDL: 86.33
Total CHOL/HDL Ratio: 2
Triglycerides: 52 mg/dL (ref 0.0–149.0)
VLDL: 10.4 mg/dL (ref 0.0–40.0)

## 2021-04-29 LAB — IRON: Iron: 18 ug/dL — ABNORMAL LOW (ref 42–145)

## 2021-04-29 LAB — CBC
HCT: 32 % — ABNORMAL LOW (ref 36.0–46.0)
Hemoglobin: 10.3 g/dL — ABNORMAL LOW (ref 12.0–15.0)
MCHC: 32.1 g/dL (ref 30.0–36.0)
MCV: 75.3 fl — ABNORMAL LOW (ref 78.0–100.0)
Platelets: 252 10*3/uL (ref 150.0–400.0)
RBC: 4.25 Mil/uL (ref 3.87–5.11)
RDW: 18.1 % — ABNORMAL HIGH (ref 11.5–15.5)
WBC: 5.9 10*3/uL (ref 4.0–10.5)

## 2021-04-29 LAB — BASIC METABOLIC PANEL
BUN: 19 mg/dL (ref 6–23)
CO2: 19 mEq/L (ref 19–32)
Calcium: 8.5 mg/dL (ref 8.4–10.5)
Chloride: 109 mEq/L (ref 96–112)
Creatinine, Ser: 0.88 mg/dL (ref 0.40–1.20)
GFR: 83 mL/min (ref >60.00)
Glucose, Bld: 93 mg/dL (ref 70–99)
Potassium: 3.9 mEq/L (ref 3.5–5.1)
Sodium: 137 mEq/L (ref 135–145)

## 2021-04-29 LAB — HEMOGLOBIN A1C: Hgb A1c MFr Bld: 5.7 % (ref 4.6–6.5)

## 2021-04-29 LAB — FERRITIN: Ferritin: 3.6 ng/mL — ABNORMAL LOW (ref 10.0–291.0)

## 2021-04-29 MED ORDER — FERROUS SULFATE 300 (60 FE) MG/5ML PO SYRP: 300.0000 mg | ORAL_SOLUTION | Freq: Every day | ORAL | 3 refills | Status: DC

## 2021-04-29 NOTE — Progress Notes (Signed)
HPI: Zoe Randall is a 40 y.o. female, who is here today for her routine physical.  Last CPE: 05/04/2018  Regular exercise 3 or more time per week: She was walking 4-5 times per day until recently, started back school. Depending on schedule she still doing it some times. Following a healthy diet: She is trying to watch calories intake, has noted wt loss. She lives with her husband and 3 children.  Chronic medical problems: Prediabetes, migraine headaches, back pain s/p lumbar microdiscectomy in 12/2019, GERD, and iron deficiency anemia among some.  Pap smear:12/2020. She is established with gynecologist.  Immunization History  Administered Date(s) Administered   Influenza,inj,Quad PF,6+ Mos 05/06/2018, 04/29/2021   PFIZER(Purple Top)SARS-COV-2 Vaccination 04/12/2020   Tdap 02/27/2013   Hep C screening: Never  Iron deficiency anemia: She has not been consistent with taking iron supplementation, taking it with menses. It causes mild nausea. OCP's exacerbate migraines.  LMP 04/10/21.   Lab Results  Component Value Date   WBC 14.4 (H) 01/06/2020   HGB 10.5 (L) 01/06/2020   HCT 33.8 (L) 01/06/2020   MCV 80.1 01/06/2020   PLT 211 01/06/2020   Glucose has been elevated in the past, 132 in 12/2019.  Lab Results  Component Value Date   HGBA1C 5.5 02/02/2020   Lab Results  Component Value Date   CHOL 133 10/19/2016   HDL 53.50 10/19/2016   LDLCALC 66 10/19/2016   TRIG 65.0 10/19/2016   CHOLHDL 2 10/19/2016   Feeling some right LE pain at night for the past few months, similar to pain she had before her back surgery. She is planning on arranging appt with ortho. No back pain, numbness, tingling, bowel/bladder dysfunction, or weakness.  Review of Systems  Constitutional:  Negative for appetite change, fatigue and fever.  HENT:  Negative for hearing loss, mouth sores, sore throat, trouble swallowing and voice change.   Eyes:  Negative for redness and visual  disturbance.  Respiratory:  Negative for cough, shortness of breath and wheezing.   Cardiovascular:  Negative for chest pain and leg swelling.  Gastrointestinal:  Negative for abdominal pain, nausea and vomiting.       No changes in bowel habits.  Endocrine: Negative for cold intolerance, heat intolerance, polydipsia, polyphagia and polyuria.  Genitourinary:  Negative for decreased urine volume, dysuria, hematuria, vaginal bleeding and vaginal discharge.  Musculoskeletal:  Negative for back pain and gait problem.  Skin:  Negative for color change and rash.  Allergic/Immunologic: Negative for environmental allergies.  Neurological:  Negative for syncope, weakness and headaches.  Hematological:  Negative for adenopathy. Does not bruise/bleed easily.  Psychiatric/Behavioral:  Negative for confusion. The patient is not nervous/anxious.   All other systems reviewed and are negative.  Current Outpatient Medications on File Prior to Visit  Medication Sig Dispense Refill   ferrous sulfate 325 (65 FE) MG tablet Take 1 tablet (325 mg total) by mouth daily. 90 tablet 3   topiramate (TOPAMAX) 100 MG tablet TAKE 1 TABLET BY MOUTH EVERY DAY 30 tablet 0   No current facility-administered medications on file prior to visit.   Past Medical History:  Diagnosis Date   Abnormal Pap smear    Anemia    Chicken pox    Frequent headaches    Herpes    Past Surgical History:  Procedure Laterality Date   COLPOSCOPY     LUMBAR LAMINECTOMY/DECOMPRESSION MICRODISCECTOMY N/A 01/05/2020   Procedure: RIGHT L5-S1 MICRODISCECTOMY;  Surgeon: Eldred Manges, MD;  Location:  [facility] OR;  Service: Orthopedics;  Laterality: N/A;   SALPINGOOPHORECTOMY     TUBAL LIGATION Bilateral 03/28/2013   Procedure: POST PARTUM TUBAL LIGATION;  Surgeon: Geryl Rankins, MD;  Location: [facility];  Service: Gynecology;  Laterality: Bilateral;   No Known Allergies  Family History  Problem Relation Age of Onset   Hypertension Maternal  Grandmother    Hypertension Mother    Hypertension Father    Cancer Neg Hx    Social History   Socioeconomic History   Marital status: Married    Spouse name: Not on file   Number of children: Not on file   Years of education: Not on file   Highest education level: Not on file  Occupational History   Not on file  Tobacco Use   Smoking status: Never   Smokeless tobacco: Never  Substance and Sexual Activity   Alcohol use: No   Drug use: No   Sexual activity: Not on file  Other Topics Concern   Not on file  Social History Narrative   Not on file   Social Determinants of Health   Financial Resource Strain: Not on file  Food Insecurity: Not on file  Transportation Needs: Not on file  Physical Activity: Not on file  Stress: Not on file  Social Connections: Not on file   Vitals:   04/29/21 0759  BP: 120/70  Pulse: 77  Resp: 12  Temp: 98.6 F (37 C)  SpO2: 99%   Body mass index is 35.36 kg/m.  Wt Readings from Last 3 Encounters:  04/29/21 206 lb (93.4 kg)  02/13/20 (!) 223 lb (101.2 kg)  02/02/20 223 lb (101.2 kg)   Physical Exam Vitals and nursing note reviewed.  Constitutional:      General: She is not in acute distress.    Appearance: She is well-developed.  HENT:     Head: Normocephalic and atraumatic.     Right Ear: Hearing, tympanic membrane, ear canal and external ear normal.     Left Ear: Hearing, tympanic membrane, ear canal and external ear normal.     Mouth/Throat:     Mouth: Mucous membranes are moist.     Pharynx: Oropharynx is clear. Uvula midline.  Eyes:     Extraocular Movements: Extraocular movements intact.     Conjunctiva/sclera: Conjunctivae normal.     Pupils: Pupils are equal, round, and reactive to light.  Neck:     Thyroid: No thyromegaly.     Trachea: No tracheal deviation.  Cardiovascular:     Rate and Rhythm: Normal rate and regular rhythm.     Pulses:          Dorsalis pedis pulses are 2+ on the right side and 2+ on the  left side.     Heart sounds: No murmur heard. Pulmonary:     Effort: Pulmonary effort is normal. No respiratory distress.     Breath sounds: Normal breath sounds.  Abdominal:     Palpations: Abdomen is soft. There is no hepatomegaly or mass.     Tenderness: There is no abdominal tenderness.  Genitourinary:    Comments: Deferred to gyn. Musculoskeletal:     Comments: No major deformity or signs of synovitis appreciated.  Lymphadenopathy:     Cervical: No cervical adenopathy.     Upper Body:     Right upper body: No supraclavicular adenopathy.     Left upper body: No supraclavicular adenopathy.  Skin:    General: Skin is warm.  Findings: No erythema or rash.  Neurological:     General: No focal deficit present.     Mental Status: She is alert and oriented to person, place, and time.     Cranial Nerves: No cranial nerve deficit.     Coordination: Coordination normal.     Gait: Gait normal.     Deep Tendon Reflexes:     Reflex Scores:      Bicep reflexes are 2+ on the right side and 2+ on the left side.      Patellar reflexes are 2+ on the right side and 2+ on the left side. Psychiatric:        Speech: Speech normal.     Comments: Well groomed, good eye contact.   ASSESSMENT AND PLAN:  Ms. DAE ANTONUCCI was here today annual physical examination.  Orders Placed This Encounter  Procedures   Flu Vaccine QUAD 12mo+IM (Fluarix, Fluzone & Alfiuria Quad PF)   Hepatitis C antibody   Basic metabolic panel   CBC   Hemoglobin A1c   Lipid panel   Iron   Ferritin   Lab Results  Component Value Date   WBC 5.9 04/29/2021   HGB 10.3 (L) 04/29/2021   HCT 32.0 (L) 04/29/2021   MCV 75.3 (L) 04/29/2021   PLT 252.0 04/29/2021   Lab Results  Component Value Date   HGBA1C 5.7 04/29/2021   Lab Results  Component Value Date   CREATININE 0.88 04/29/2021   BUN 19 04/29/2021   NA 137 04/29/2021   K 3.9 04/29/2021   CL 109 04/29/2021   CO2 19 04/29/2021   Lab Results   Component Value Date   CHOL 148 04/29/2021   HDL 61.90 04/29/2021   LDLCALC 76 04/29/2021   TRIG 52.0 04/29/2021   CHOLHDL 2 04/29/2021   Routine general medical examination at a health care facility We discussed the importance of regular physical activity and healthy diet for prevention of chronic illness and/or complications. Preventive guidelines reviewed. Vaccination up to date. Continue female preventive care with her gyn. Next CPE in a year.  Encounter for HCV screening test for low risk patient -     Hepatitis C antibody  Need for influenza vaccination -     Flu Vaccine QUAD 70mo+IM (Fluarix, Fluzone & Alfiuria Quad PF)  Screening for lipoid disorders -     Lipid panel  Screening for endocrine, metabolic and immunity disorder -     Hemoglobin A1c -     Basic metabolic panel  Iron deficiency anemia Caused by heavy menses,recommend discussion with her gyn for treatment options. We can consider iron in liquid form, will wait for lab results.  Return in 1 year (on 04/29/2022) for cpe.   Moriah Shawley G. [place], MD  [facility]. [place] office.

## 2021-04-29 NOTE — Patient Instructions (Addendum)
Today you have you routine preventive visit. A few things to remember from today's visit:   Routine general medical examination at a health care facility  Encounter for HCV screening test for low risk patient - Plan: Hepatitis C antibody  Need for influenza vaccination - Plan: Flu Vaccine QUAD 32mo+IM (Fluarix, Fluzone & Alfiuria Quad PF)  Iron deficiency anemia due to chronic blood loss - Plan: CBC, Iron, Ferritin  Screening for lipoid disorders - Plan: Lipid panel  Screening for endocrine, metabolic and immunity disorder - Plan: Basic metabolic panel, Hemoglobin A1c  Do not use My Chart to request refills or for acute issues that need immediate attention.  Please be sure medication list is accurate. If a new problem present, please set up appointment sooner than planned today.  At least 150 minutes of moderate exercise per week, daily brisk walking for 15-30 min is a good exercise option. Healthy diet low in saturated (animal) fats and sweets and consisting of fresh fruits and vegetables, lean meats such as fish and white chicken and whole grains.  These are some of recommendations for screening depending of age and risk factors:  - Vaccines:  Tdap vaccine every 10 years.  Shingles vaccine recommended at age 50, could be given after 39 years of age but not sure about insurance coverage.   Pneumonia vaccines: Pneumovax at 65. Sometimes Pneumovax is giving earlier if history of smoking, lung disease,diabetes,kidney disease among some.  Screening for diabetes at age 36 and every 3 years.  Cervical cancer prevention:  Pap smear starts at 39 years of age and continues periodically until 39 years old in low risk women. Pap smear every 3 years between 89 and 70 years old. Pap smear every 3-5 years between women 30 and older if pap smear negative and HPV screening negative.   -Breast cancer: Mammogram: There is disagreement between experts about when to start screening in low risk  asymptomatic female but recent recommendations are to start screening at 39 and not later than 39 years old , every 1-2 years and after 39 yo q 2 years. Screening is recommended until 39 years old but some women can continue screening depending of healthy issues.  Colon cancer screening: Has been recently changed to 39 yo. Insurance may not cover until you are 39 years old. Screening is recommended until 39 years old.  Cholesterol disorder screening at age 34 and every 3 years.  Also recommended:  Dental visit- Brush and floss your teeth twice daily; visit your dentist twice a year. Eye doctor- Get an eye exam at least every 2 years. Helmet use- Always wear a helmet when riding a bicycle, motorcycle, rollerblading or skateboarding. Safe sex- If you may be exposed to sexually transmitted infections, use a condom. Seat belts- Seat belts can save your live; always wear one. Smoke/Carbon Monoxide detectors- These detectors need to be installed on the appropriate level of your home. Replace batteries at least once a year. Skin cancer- When out in the sun please cover up and use sunscreen 15 SPF or higher. Violence- If anyone is threatening or hurting you, please tell your healthcare provider.  Drink alcohol in moderation- Limit alcohol intake to one drink or less per day. Never drink and drive. Calcium supplementation 1000 to 1200 mg daily, ideally through your diet.  Vitamin D supplementation 800 units daily.

## 2021-04-29 NOTE — Assessment & Plan Note (Addendum)
Caused by heavy menses,recommend discussion with her gyn for treatment options. We can consider iron in liquid form, will wait for lab results.

## 2021-04-30 ENCOUNTER — Other Ambulatory Visit: Payer: Self-pay | Admitting: Family Medicine

## 2021-04-30 LAB — HEPATITIS C ANTIBODY
Hepatitis C Ab: NONREACTIVE
SIGNAL TO CUT-OFF: <0.02 (ref <1.00)

## 2021-05-05 MED ORDER — TOPIRAMATE 100 MG PO TABS: 100.0000 mg | ORAL_TABLET | Freq: Every day | ORAL | 0 refills | Status: DC

## 2021-07-13 ENCOUNTER — Other Ambulatory Visit: Payer: Self-pay | Admitting: Family Medicine

## 2021-12-05 ENCOUNTER — Telehealth: Payer: Self-pay | Admitting: Family Medicine

## 2021-12-05 DIAGNOSIS — D500 Iron deficiency anemia secondary to blood loss (chronic): Secondary | ICD-10-CM

## 2021-12-05 MED ORDER — FERROUS SULFATE 300 (60 FE) MG/5ML PO SYRP: 300.0000 mg | ORAL_SOLUTION | Freq: Every day | ORAL | 3 refills | Status: DC

## 2021-12-05 NOTE — Telephone Encounter (Signed)
I called and spoke with patient. She is taking Floradix 10 ML's twice a day. Okay for pt to continue?

## 2021-12-05 NOTE — Telephone Encounter (Signed)
Pt states the liquid iron she was prescribed was not covered so you got her own and she's requesting guidance on whether the provider thinks this will work. Requesting a call.

## 2021-12-05 NOTE — Addendum Note (Signed)
Addended by: Kathreen Devoid on: 12/05/2021 04:14 PM   Modules accepted: Orders

## 2021-12-05 NOTE — Telephone Encounter (Signed)
I called and spoke with patient. We went over the information below & she verbalized understanding. Rx re-sent for the liquid iron. Patient will call us back if urine color doesn't improve or if new symptoms occur.

## 2021-12-05 NOTE — Telephone Encounter (Signed)
I recommended iron 300 mg (liquid) daily. Anemia thought to be caused by heavy menses, so recommend following with gyn. Some medications could cause color changes in urine, she can stop med for a few days and monitor for changes. If new urinary symptoms present, she needs to be evaluated. Thanks, BJ

## 2021-12-05 NOTE — Telephone Encounter (Signed)
---  Caller states she is concerned that her urine is a neon yellow color. She takes liquid iron and she thinks it may be causing it. She started taking it a few days ago. No other symptoms  12/05/2021 8:39:41 AM See PCP within 2 Judie Grieve, RN, [facility] [place]  Referrals REFERRED TO PCP OFFICE

## 2021-12-10 ENCOUNTER — Telehealth: Payer: Self-pay | Admitting: Family Medicine

## 2021-12-10 NOTE — Telephone Encounter (Signed)
Patient called because the pharmacy has notified her that the manufacturer of the ferrous sulfate 300 (60 Fe) MG/5ML syrup is no longer making it. Patient would like an alternative called in, preferably liquid form, but if a liquid alternative is not available, a pill would be fine.    Please send to [id number] [place], [place] - [address] Phone:  [phone]  Fax:  [phone]         Please advise

## 2021-12-24 ENCOUNTER — Other Ambulatory Visit: Payer: Self-pay | Admitting: Family Medicine

## 2021-12-24 DIAGNOSIS — D500 Iron deficiency anemia secondary to blood loss (chronic): Secondary | ICD-10-CM

## 2021-12-24 MED ORDER — FERROUS SULFATE 325 (65 FE) MG PO TABS: 325.0000 mg | ORAL_TABLET | Freq: Every day | ORAL | 1 refills | Status: DC

## 2021-12-24 NOTE — Telephone Encounter (Signed)
Prescription for ferrous sulfate 325 mg sent to her pharmacy, she can take 1 every day or every other day with vitamin C. Thanks, BJ

## 2021-12-25 NOTE — Telephone Encounter (Signed)
Pt notified of PCP response & verb understanding. 

## 2022-01-05 NOTE — Progress Notes (Unsigned)
HPI: Zoe Randall is a 41 y.o. female, who is here today for her routine physical.  Last CPE: 04/29/21  She has not been exercising as much as she did last year, walking 2 times per week. She is also active with house chores. She is trying to cook more at home, not a lot of vegetables. She eat fruit,pasta,and rice.  Chronic medical problems: GERD, migraine, iron deficiency anemia, and prediabetes among some.  Immunization History  Administered Date(s) Administered   Influenza,inj,Quad PF,6+ Mos 05/06/2018, 04/29/2021   PFIZER(Purple Top)SARS-COV-2 Vaccination 04/12/2020   Tdap 02/27/2013   Health Maintenance  Topic Date Due   COVID-19 Vaccine (2 - Pfizer series) 01/21/2022 (Originally 06/07/2020)   INFLUENZA VACCINE  02/17/2022   TETANUS/TDAP  02/28/2023   PAP SMEAR-Modifier  12/19/2023   Hepatitis C Screening  Completed   HIV Screening  Completed   HPV VACCINES  Aged [employer] with gyn annually. Last pap smear 12/2020.  Iron def anemia: Resumed Fe sulfate 3 weeks ago because she was feeling fatigue,noted hair thinning,brittle nails. + Pica for ice. Heavy menses. LMP 12/20/21. Lab Results  Component Value Date   WBC 5.9 04/29/2021   HGB 10.3 (L) 04/29/2021   HCT 32.0 (L) 04/29/2021   MCV 75.3 (L) 04/29/2021   PLT 252.0 04/29/2021   Lab Results  Component Value Date   HGBA1C 5.7 04/29/2021   Migraine headache: She is on Topamax 100 mg daily at night. It has helped with headaches, she does not have it frequently. No new associated symptoms.  Review of Systems  Constitutional:  Negative for appetite change and fever.  HENT:  Negative for hearing loss, mouth sores, sore throat, trouble swallowing and voice change.   Eyes:  Negative for redness and visual disturbance.  Respiratory:  Negative for cough, shortness of breath and wheezing.   Cardiovascular:  Negative for chest pain and leg swelling.  Gastrointestinal:  Negative for abdominal pain, nausea and  vomiting.       No changes in bowel habits.  Endocrine: Negative for cold intolerance, heat intolerance, polydipsia, polyphagia and polyuria.  Genitourinary:  Negative for decreased urine volume, dysuria, hematuria, vaginal bleeding and vaginal discharge.  Musculoskeletal:  Negative for gait problem and joint swelling.  Skin:  Negative for color change and rash.  Allergic/Immunologic: Negative for environmental allergies.  Neurological:  Negative for syncope, facial asymmetry and weakness.  Hematological:  Negative for adenopathy. Does not bruise/bleed easily.  Psychiatric/Behavioral:  Negative for confusion. The patient is not nervous/anxious.   All other systems reviewed and are negative.  Current Outpatient Medications on File Prior to Visit  Medication Sig Dispense Refill   ferrous sulfate 325 (65 FE) MG tablet Take 1 tablet (325 mg total) by mouth daily. 90 tablet 1   valACYclovir (VALTREX) 500 MG tablet Take 500 mg by mouth daily.     No current facility-administered medications on file prior to visit.   Past Medical History:  Diagnosis Date   Abnormal Pap smear    Anemia    Chicken pox    Frequent headaches    Herpes    Past Surgical History:  Procedure Laterality Date   COLPOSCOPY     LUMBAR LAMINECTOMY/DECOMPRESSION MICRODISCECTOMY N/A 01/05/2020   Procedure: RIGHT L5-S1 MICRODISCECTOMY;  Surgeon: Eldred Manges, MD;  Location: [facility] OR;  Service: Orthopedics;  Laterality: N/A;   SALPINGOOPHORECTOMY     TUBAL LIGATION Bilateral 03/28/2013   Procedure: POST PARTUM TUBAL LIGATION;  Surgeon: Renea Ee  Dion Body, MD;  Location: [facility] ORS;  Service: Gynecology;  Laterality: Bilateral;   No Known Allergies  Family History  Problem Relation Age of Onset   Hypertension Maternal Grandmother    Hypertension Mother    Hypertension Father    Cancer Neg Hx    Social History   Socioeconomic History   Marital status: Married    Spouse name: Not on file   Number of children: Not on file    Years of education: Not on file   Highest education level: Not on file  Occupational History   Not on file  Tobacco Use   Smoking status: Never   Smokeless tobacco: Never  Substance and Sexual Activity   Alcohol use: No   Drug use: No   Sexual activity: Not on file  Other Topics Concern   Not on file  Social History Narrative   Not on file   Social Determinants of Health   Financial Resource Strain: Not on file  Food Insecurity: Not on file  Transportation Needs: Not on file  Physical Activity: Not on file  Stress: Not on file  Social Connections: Not on file   Vitals:   01/06/22 0825  BP: 120/70  Pulse: 78  Resp: 16  Temp: 98.7 F (37.1 C)  SpO2: 99%  Body mass index is 35.02 kg/m. Wt Readings from Last 3 Encounters:  01/06/22 204 lb (92.5 kg)  04/29/21 206 lb (93.4 kg)  02/13/20 (!) 223 lb (101.2 kg)   Physical Exam Vitals and nursing note reviewed.  Constitutional:      General: She is not in acute distress.    Appearance: She is well-developed.  HENT:     Head: Normocephalic and atraumatic.     Right Ear: Tympanic membrane, ear canal and external ear normal.     Left Ear: Tympanic membrane, ear canal and external ear normal.     Mouth/Throat:     Mouth: Mucous membranes are moist.     Pharynx: Oropharynx is clear. Uvula midline.  Eyes:     Extraocular Movements: Extraocular movements intact.     Conjunctiva/sclera: Conjunctivae normal.     Pupils: Pupils are equal, round, and reactive to light.  Neck:     Thyroid: No thyroid mass.     Trachea: No tracheal deviation.  Cardiovascular:     Rate and Rhythm: Normal rate and regular rhythm.     Pulses:          Dorsalis pedis pulses are 2+ on the right side and 2+ on the left side.     Heart sounds: No murmur heard. Pulmonary:     Effort: Pulmonary effort is normal. No respiratory distress.     Breath sounds: Normal breath sounds.  Abdominal:     Palpations: Abdomen is soft. There is no  hepatomegaly or mass.     Tenderness: There is no abdominal tenderness.  Genitourinary:    Comments: Deferred to gyn. Musculoskeletal:     Comments: No major deformity or signs of synovitis appreciated.  Lymphadenopathy:     Cervical: No cervical adenopathy.     Upper Body:     Right upper body: No supraclavicular adenopathy.     Left upper body: No supraclavicular adenopathy.  Skin:    General: Skin is warm.     Findings: No erythema or rash.  Neurological:     General: No focal deficit present.     Mental Status: She is alert and oriented to person, place, and  time.     Cranial Nerves: No cranial nerve deficit.     Coordination: Coordination normal.     Gait: Gait normal.     Deep Tendon Reflexes:     Reflex Scores:      Bicep reflexes are 2+ on the right side and 2+ on the left side.      Patellar reflexes are 2+ on the right side and 2+ on the left side. Psychiatric:     Comments: Well groomed, good eye contact.   ASSESSMENT AND PLAN:  Zoe Randall was here today annual physical examination.  Orders Placed This Encounter  Procedures   CBC with Differential/Platelet   Iron   Ferritin   Basic metabolic panel   Hemoglobin A1c   TSH   Lab Results  Component Value Date   TSH 1.95 01/06/2022   Lab Results  Component Value Date   CREATININE 0.94 01/06/2022   BUN 12 01/06/2022   NA 137 01/06/2022   K 3.9 01/06/2022   CL 106 01/06/2022   CO2 24 01/06/2022   Lab Results  Component Value Date   HGBA1C 5.3 01/06/2022   Lab Results  Component Value Date   WBC 7.1 01/06/2022   HGB 10.5 (L) 01/06/2022   HCT 33.5 (L) 01/06/2022   MCV 76.0 (L) 01/06/2022   PLT 261.0 01/06/2022    Zoe Randall was seen today for annual exam.  Diagnoses and all orders for this visit:  Routine general medical examination at a health care facility We discussed the importance of regular physical activity and healthy diet for prevention of chronic illness and/or  complications. Preventive guidelines reviewed. Vaccination up to date. Continue following with gyn for her female preventive care. Next CPE in a year.  Screening for endocrine, metabolic and immunity disorder -     Hemoglobin A1c -     Basic metabolic panel  Menorrhagia with regular cycle Continue following with gyn.  Migraine headache with aura Problem is stable, well controlled with Topamax 100 mg at bedtime. No changes in current management. Some side effects of medication discussed. She knows to continue preventing pregnancy.  Iron deficiency anemia Continue Ferrous sulfate 325 mg every other day with vit D. Further recommendations according to CBC and iron studies results.  Return in 1 year (on 01/07/2023) for CPE.  Zoe Randall G. [place], MD  [facility]. [place] office.

## 2022-01-06 ENCOUNTER — Ambulatory Visit (INDEPENDENT_AMBULATORY_CARE_PROVIDER_SITE_OTHER): Payer: No Typology Code available for payment source | Admitting: Family Medicine

## 2022-01-06 ENCOUNTER — Encounter: Payer: Self-pay | Admitting: Family Medicine

## 2022-01-06 VITALS — BP 120/70 | HR 78 | Temp 98.7°F | Resp 16 | Ht 64.0 in | Wt 204.0 lb

## 2022-01-06 DIAGNOSIS — N920 Excessive and frequent menstruation with regular cycle: Secondary | ICD-10-CM | POA: Diagnosis not present

## 2022-01-06 DIAGNOSIS — Z0000 Encounter for general adult medical examination without abnormal findings: Secondary | ICD-10-CM

## 2022-01-06 DIAGNOSIS — D500 Iron deficiency anemia secondary to blood loss (chronic): Secondary | ICD-10-CM

## 2022-01-06 DIAGNOSIS — Z13220 Encounter for screening for lipoid disorders: Secondary | ICD-10-CM

## 2022-01-06 DIAGNOSIS — Z13228 Encounter for screening for other metabolic disorders: Secondary | ICD-10-CM | POA: Diagnosis not present

## 2022-01-06 DIAGNOSIS — Z1329 Encounter for screening for other suspected endocrine disorder: Secondary | ICD-10-CM

## 2022-01-06 DIAGNOSIS — G43109 Migraine with aura, not intractable, without status migrainosus: Secondary | ICD-10-CM

## 2022-01-06 DIAGNOSIS — Z130 Encounter for screening for diseases of the blood and blood-forming organs and certain disorders involving the immune mechanism: Secondary | ICD-10-CM

## 2022-01-06 LAB — BASIC METABOLIC PANEL
BUN: 12 mg/dL (ref 6–23)
CO2: 24 mEq/L (ref 19–32)
Calcium: 9.3 mg/dL (ref 8.4–10.5)
Chloride: 106 mEq/L (ref 96–112)
Creatinine, Ser: 0.94 mg/dL (ref 0.40–1.20)
GFR: 76.32 mL/min (ref >60.00)
Glucose, Bld: 86 mg/dL (ref 70–99)
Potassium: 3.9 mEq/L (ref 3.5–5.1)
Sodium: 137 mEq/L (ref 135–145)

## 2022-01-06 LAB — CBC WITH DIFFERENTIAL/PLATELET
Basophils Absolute: 0 10*3/uL (ref 0.0–0.1)
Basophils Relative: 0.5 % (ref 0.0–3.0)
Eosinophils Absolute: 0.1 10*3/uL (ref 0.0–0.7)
Eosinophils Relative: 0.8 % (ref 0.0–5.0)
HCT: 33.5 % — ABNORMAL LOW (ref 36.0–46.0)
Hemoglobin: 10.5 g/dL — ABNORMAL LOW (ref 12.0–15.0)
Lymphocytes Relative: 18.4 % (ref 12.0–46.0)
Lymphs Abs: 1.3 10*3/uL (ref 0.7–4.0)
MCHC: 31.3 g/dL (ref 30.0–36.0)
MCV: 76 fl — ABNORMAL LOW (ref 78.0–100.0)
Monocytes Absolute: 0.8 10*3/uL (ref 0.1–1.0)
Monocytes Relative: 11.2 % (ref 3.0–12.0)
Neutro Abs: 4.9 10*3/uL (ref 1.4–7.7)
Neutrophils Relative %: 69.1 % (ref 43.0–77.0)
Platelets: 261 10*3/uL (ref 150.0–400.0)
RBC: 4.4 Mil/uL (ref 3.87–5.11)
RDW: 24.7 % — ABNORMAL HIGH (ref 11.5–15.5)
WBC: 7.1 10*3/uL (ref 4.0–10.5)

## 2022-01-06 LAB — HEMOGLOBIN A1C: Hgb A1c MFr Bld: 5.3 % (ref 4.6–6.5)

## 2022-01-06 LAB — FERRITIN: Ferritin: 9.2 ng/mL — ABNORMAL LOW (ref 10.0–291.0)

## 2022-01-06 LAB — IRON: Iron: 283 ug/dL — ABNORMAL HIGH (ref 42–145)

## 2022-01-06 LAB — TSH: TSH: 1.95 u[IU]/mL (ref 0.35–5.50)

## 2022-01-06 MED ORDER — TOPIRAMATE 100 MG PO TABS: 100.0000 mg | ORAL_TABLET | Freq: Every day | ORAL | 3 refills | Status: DC

## 2022-01-06 NOTE — Patient Instructions (Addendum)
A few things to remember from today's visit:  Routine general medical examination at a health care facility  Screening for lipoid disorders  Screening for endocrine, metabolic and immunity disorder - Plan: Basic metabolic panel, Hemoglobin A1c  Iron deficiency anemia due to chronic blood loss - Plan: CBC with Differential/Platelet, Iron, Ferritin, TSH  Menorrhagia with regular cycle - Plan: TSH  If you need refills please call your pharmacy. Do not use My Chart to request refills or for acute issues that need immediate attention.   Please be sure medication list is accurate. If a new problem present, please set up appointment sooner than planned today.  Health Maintenance, Female Adopting a healthy lifestyle and getting preventive care are important in promoting health and wellness. Ask your health care provider about: The right schedule for you to have regular tests and exams. Things you can do on your own to prevent diseases and keep yourself healthy. What should I know about diet, weight, and exercise? Eat a healthy diet  Eat a diet that includes plenty of vegetables, fruits, low-fat dairy products, and lean protein. Do not eat a lot of foods that are high in solid fats, added sugars, or sodium. Maintain a healthy weight Body mass index (BMI) is used to identify weight problems. It estimates body fat based on height and weight. Your health care provider can help determine your BMI and help you achieve or maintain a healthy weight. Get regular exercise Get regular exercise. This is one of the most important things you can do for your health. Most adults should: Exercise for at least 150 minutes each week. The exercise should increase your heart rate and make you sweat (moderate-intensity exercise). Do strengthening exercises at least twice a week. This is in addition to the moderate-intensity exercise. Spend less time sitting. Even light physical activity can be beneficial. Watch  cholesterol and blood lipids Have your blood tested for lipids and cholesterol at 40 years of age, then have this test every 5 years. Have your cholesterol levels checked more often if: Your lipid or cholesterol levels are high. You are older than 40 years of age. You are at high risk for heart disease. What should I know about cancer screening? Depending on your health history and family history, you may need to have cancer screening at various ages. This may include screening for: Breast cancer. Cervical cancer. Colorectal cancer. Skin cancer. Lung cancer. What should I know about heart disease, diabetes, and high blood pressure? Blood pressure and heart disease High blood pressure causes heart disease and increases the risk of stroke. This is more likely to develop in people who have high blood pressure readings or are overweight. Have your blood pressure checked: Every 3-5 years if you are 85-29 years of age. Every year if you are 51 years old or older. Diabetes Have regular diabetes screenings. This checks your fasting blood sugar level. Have the screening done: Once every three years after age 90 if you are at a normal weight and have a low risk for diabetes. More often and at a younger age if you are overweight or have a high risk for diabetes. What should I know about preventing infection? Hepatitis B If you have a higher risk for hepatitis B, you should be screened for this virus. Talk with your health care provider to find out if you are at risk for hepatitis B infection. Hepatitis C Testing is recommended for: Everyone born from [birth year] through 1964. Anyone with known risk  factors for hepatitis C. Sexually transmitted infections (STIs) Get screened for STIs, including gonorrhea and chlamydia, if: You are sexually active and are younger than 40 years of age. You are older than 40 years of age and your health care provider tells you that you are at risk for this type of  infection. Your sexual activity has changed since you were last screened, and you are at increased risk for chlamydia or gonorrhea. Ask your health care provider if you are at risk. Ask your health care provider about whether you are at high risk for HIV. Your health care provider may recommend a prescription medicine to help prevent HIV infection. If you choose to take medicine to prevent HIV, you should first get tested for HIV. You should then be tested every 3 months for as long as you are taking the medicine. Pregnancy If you are about to stop having your period (premenopausal) and you may become pregnant, seek counseling before you get pregnant. Take 400 to 800 micrograms (mcg) of folic acid every day if you become pregnant. Ask for birth control (contraception) if you want to prevent pregnancy. Osteoporosis and menopause Osteoporosis is a disease in which the bones lose minerals and strength with aging. This can result in bone fractures. If you are 43 years old or older, or if you are at risk for osteoporosis and fractures, ask your health care provider if you should: Be screened for bone loss. Take a calcium or vitamin D supplement to lower your risk of fractures. Be given hormone replacement therapy (HRT) to treat symptoms of menopause. Follow these instructions at home: Alcohol use Do not drink alcohol if: Your health care provider tells you not to drink. You are pregnant, may be pregnant, or are planning to become pregnant. If you drink alcohol: Limit how much you have to: 0-1 drink a day. Know how much alcohol is in your drink. In the [place] one drink equals one 12 oz bottle of beer (355 mL), one 5 oz glass of wine (148 mL), or one 1 oz glass of hard liquor (44 mL). Lifestyle Do not use any products that contain nicotine or tobacco. These products include cigarettes, chewing tobacco, and vaping devices, such as e-cigarettes. If you need help quitting, ask your health care  provider. Do not use street drugs. Do not share needles. Ask your health care provider for help if you need support or information about quitting drugs. General instructions Schedule regular health, dental, and eye exams. Stay current with your vaccines. Tell your health care provider if: You often feel depressed. You have ever been abused or do not feel safe at home. Summary Adopting a healthy lifestyle and getting preventive care are important in promoting health and wellness. Follow your health care provider's instructions about healthy diet, exercising, and getting tested or screened for diseases. Follow your health care provider's instructions on monitoring your cholesterol and blood pressure. This information is not intended to replace advice given to you by your health care provider. Make sure you discuss any questions you have with your health care provider. Document Revised: 11/25/2020 Document Reviewed: 11/25/2020 Elsevier Patient Education  [place].

## 2022-01-06 NOTE — Assessment & Plan Note (Signed)
Continue Ferrous sulfate 325 mg every other day with vit D. Further recommendations according to CBC and iron studies results.

## 2022-01-06 NOTE — Assessment & Plan Note (Signed)
Problem is stable, well controlled with Topamax 100 mg at bedtime. No changes in current management. Some side effects of medication discussed. She knows to continue preventing pregnancy.

## 2022-06-16 ENCOUNTER — Other Ambulatory Visit: Payer: Self-pay | Admitting: Family Medicine

## 2022-06-16 DIAGNOSIS — D500 Iron deficiency anemia secondary to blood loss (chronic): Secondary | ICD-10-CM

## 2022-06-30 NOTE — Progress Notes (Unsigned)
   ACUTE VISIT No chief complaint on file.  HPI: [name] is a 41 y.o. female, who is here today complaining of *** HPI  Review of Systems See other pertinent positives and negatives in HPI.  Current Outpatient Medications on File Prior to Visit  Medication Sig Dispense Refill   ferrous sulfate 325 (65 FE) MG tablet TAKE 1 TABLET BY MOUTH EVERY DAY 90 tablet 1   topiramate (TOPAMAX) 100 MG tablet Take 1 tablet (100 mg total) by mouth daily. 90 tablet 3   valACYclovir (VALTREX) 500 MG tablet Take 500 mg by mouth daily.     No current facility-administered medications on file prior to visit.    Past Medical History:  Diagnosis Date   Abnormal Pap smear    Anemia    Chicken pox    Frequent headaches    Herpes    No Known Allergies  Social History   Socioeconomic History   Marital status: Married    Spouse name: Not on file   Number of children: Not on file   Years of education: Not on file   Highest education level: Not on file  Occupational History   Not on file  Tobacco Use   Smoking status: Never   Smokeless tobacco: Never  Substance and Sexual Activity   Alcohol use: No   Drug use: No   Sexual activity: Not on file  Other Topics Concern   Not on file  Social History Narrative   Not on file   Social Determinants of Health   Financial Resource Strain: Not on file  Food Insecurity: Not on file  Transportation Needs: Not on file  Physical Activity: Not on file  Stress: Not on file  Social Connections: Not on file    There were no vitals filed for this visit. There is no height or weight on file to calculate BMI.  Physical Exam  ASSESSMENT AND PLAN: There are no diagnoses linked to this encounter.  No follow-ups on file.  Cabot Cromartie G. [place], MD  [facility]. [place] office.  Discharge Instructions   None

## 2022-07-01 ENCOUNTER — Ambulatory Visit (INDEPENDENT_AMBULATORY_CARE_PROVIDER_SITE_OTHER): Payer: No Typology Code available for payment source | Admitting: Family Medicine

## 2022-07-01 ENCOUNTER — Encounter: Payer: Self-pay | Admitting: Family Medicine

## 2022-07-01 VITALS — BP 128/80 | HR 77 | Temp 98.6°F | Resp 12 | Ht 64.0 in | Wt 215.1 lb

## 2022-07-01 DIAGNOSIS — D509 Iron deficiency anemia, unspecified: Secondary | ICD-10-CM

## 2022-07-01 DIAGNOSIS — H8112 Benign paroxysmal vertigo, left ear: Secondary | ICD-10-CM | POA: Diagnosis not present

## 2022-07-01 LAB — BASIC METABOLIC PANEL
BUN: 14 mg/dL (ref 6–23)
CO2: 24 mEq/L (ref 19–32)
Calcium: 8.7 mg/dL (ref 8.4–10.5)
Chloride: 108 mEq/L (ref 96–112)
Creatinine, Ser: 0.89 mg/dL (ref 0.40–1.20)
GFR: 81.21 mL/min (ref >60.00)
Glucose, Bld: 88 mg/dL (ref 70–99)
Potassium: 3.8 mEq/L (ref 3.5–5.1)
Sodium: 138 mEq/L (ref 135–145)

## 2022-07-01 LAB — CBC
HCT: 39.1 % (ref 36.0–46.0)
Hemoglobin: 13 g/dL (ref 12.0–15.0)
MCHC: 33.3 g/dL (ref 30.0–36.0)
MCV: 85.2 fl (ref 78.0–100.0)
Platelets: 222 10*3/uL (ref 150.0–400.0)
RBC: 4.59 Mil/uL (ref 3.87–5.11)
RDW: 14.9 % (ref 11.5–15.5)
WBC: 6.8 10*3/uL (ref 4.0–10.5)

## 2022-07-01 LAB — HEPATIC FUNCTION PANEL
ALT: 9 U/L (ref 0–35)
AST: 13 U/L (ref 0–37)
Albumin: 3.9 g/dL (ref 3.5–5.2)
Alkaline Phosphatase: 49 U/L (ref 39–117)
Bilirubin, Direct: 0.1 mg/dL (ref 0.0–0.3)
Total Bilirubin: 0.4 mg/dL (ref 0.2–1.2)
Total Protein: 6.5 g/dL (ref 6.0–8.3)

## 2022-07-01 MED ORDER — MECLIZINE HCL 25 MG PO TABS: 25.0000 mg | ORAL_TABLET | Freq: Three times a day (TID) | ORAL | 0 refills | Status: AC | PRN

## 2022-07-01 NOTE — Assessment & Plan Note (Signed)
It has been stable, asymptomatic. Continue Ferrous sulfate 325 mg daily or every other day with vit C. Further recommendations according to CBC and iron studies results.

## 2022-07-01 NOTE — Assessment & Plan Note (Signed)
We discussed other possible etiologies of dizziness, Hx and examination today suggest benign vertigo. Explained that problem can be recurrent. Fall prevention. Vestibular exercises recommended, she prefers referral to PT instead doing it at home. Meclizine 25 mg tid prn, some side effects discussed. Instructed about warning signs. F/U as needed.

## 2022-07-01 NOTE — Patient Instructions (Signed)
A few things to remember from today's visit:  Benign paroxysmal positional vertigo of left ear - Plan: Basic metabolic panel, Ambulatory referral to Physical Therapy, meclizine (ANTIVERT) 25 MG tablet  Iron deficiency anemia, unspecified iron deficiency anemia type - Plan: CBC, Hepatic function panel, Iron, TIBC and Ferritin Panel  If you need refills for medications you take chronically, please call your pharmacy. Do not use My Chart to request refills or for acute issues that need immediate attention. If you send a my chart message, it may take a few days to be addressed, specially if I am not in the office.  Please be sure medication list is accurate. If a new problem present, please set up appointment sooner than planned today.

## 2022-07-02 LAB — IRON,TIBC AND FERRITIN PANEL
%SAT: 19 % (calc) (ref 16–45)
Ferritin: 11 ng/mL — ABNORMAL LOW (ref 16–154)
Iron: 57 ug/dL (ref 40–190)
TIBC: 293 mcg/dL (calc) (ref 250–450)

## 2023-02-11 ENCOUNTER — Other Ambulatory Visit: Payer: Self-pay | Admitting: Family Medicine

## 2023-02-11 DIAGNOSIS — G43109 Migraine with aura, not intractable, without status migrainosus: Secondary | ICD-10-CM

## 2023-08-20 ENCOUNTER — Other Ambulatory Visit: Payer: Self-pay | Admitting: Family Medicine

## 2023-08-20 DIAGNOSIS — G43109 Migraine with aura, not intractable, without status migrainosus: Secondary | ICD-10-CM

## 2023-10-08 ENCOUNTER — Encounter: Payer: Self-pay | Admitting: Family Medicine

## 2023-10-08 ENCOUNTER — Ambulatory Visit: Admitting: Family Medicine

## 2023-10-08 ENCOUNTER — Encounter: Admitting: Family Medicine

## 2023-10-08 VITALS — BP 138/80 | HR 79 | Temp 98.3°F | Resp 12 | Ht 64.0 in | Wt 211.5 lb

## 2023-10-08 DIAGNOSIS — G43109 Migraine with aura, not intractable, without status migrainosus: Secondary | ICD-10-CM

## 2023-10-08 DIAGNOSIS — Z23 Encounter for immunization: Secondary | ICD-10-CM

## 2023-10-08 DIAGNOSIS — Z0000 Encounter for general adult medical examination without abnormal findings: Secondary | ICD-10-CM

## 2023-10-08 DIAGNOSIS — R7303 Prediabetes: Secondary | ICD-10-CM | POA: Diagnosis not present

## 2023-10-08 DIAGNOSIS — D500 Iron deficiency anemia secondary to blood loss (chronic): Secondary | ICD-10-CM | POA: Diagnosis not present

## 2023-10-08 LAB — COMPREHENSIVE METABOLIC PANEL
ALT: 9 U/L (ref 0–35)
AST: 14 U/L (ref 0–37)
Albumin: 4.3 g/dL (ref 3.5–5.2)
Alkaline Phosphatase: 71 U/L (ref 39–117)
BUN: 14 mg/dL (ref 6–23)
CO2: 23 meq/L (ref 19–32)
Calcium: 9.2 mg/dL (ref 8.4–10.5)
Chloride: 107 meq/L (ref 96–112)
Creatinine, Ser: 0.91 mg/dL (ref 0.40–1.20)
GFR: 78.37 mL/min (ref >60.00)
Glucose, Bld: 95 mg/dL (ref 70–99)
Potassium: 3.8 meq/L (ref 3.5–5.1)
Sodium: 138 meq/L (ref 135–145)
Total Bilirubin: 0.3 mg/dL (ref 0.2–1.2)
Total Protein: 7.1 g/dL (ref 6.0–8.3)

## 2023-10-08 LAB — CBC
HCT: 40.2 % (ref 36.0–46.0)
Hemoglobin: 13.2 g/dL (ref 12.0–15.0)
MCHC: 32.9 g/dL (ref 30.0–36.0)
MCV: 85.8 fl (ref 78.0–100.0)
Platelets: 275 10*3/uL (ref 150.0–400.0)
RBC: 4.68 Mil/uL (ref 3.87–5.11)
RDW: 16 % — ABNORMAL HIGH (ref 11.5–15.5)
WBC: 7.9 10*3/uL (ref 4.0–10.5)

## 2023-10-08 LAB — HEMOGLOBIN A1C: Hgb A1c MFr Bld: 5.6 % (ref 4.6–6.5)

## 2023-10-08 LAB — FERRITIN: Ferritin: 15.5 ng/mL (ref 10.0–291.0)

## 2023-10-08 LAB — IRON: Iron: 70 ug/dL (ref 42–145)

## 2023-10-08 MED ORDER — TOPIRAMATE 100 MG PO TABS: 100.0000 mg | ORAL_TABLET | Freq: Every day | ORAL | 3 refills | Status: AC

## 2023-10-08 NOTE — Assessment & Plan Note (Signed)
 Currently on iron supplementation, she acknowledges she has not been very consistent with taking medication daily. Following with gynecologist for DUB, endometrial polyp. Further recommendation will be given according to CBC and iron studies.

## 2023-10-08 NOTE — Patient Instructions (Addendum)
 A few things to remember from today's visit:  Routine general medical examination at a health care facility  Prediabetes - Plan: Comprehensive metabolic panel, Hemoglobin A1c  Iron deficiency anemia due to chronic blood loss - Plan: Comprehensive metabolic panel, CBC, Iron, Ferritin  Migraine with aura and without status migrainosus, not intractable - Plan: topiramate (TOPAMAX) 100 MG tablet  Monitor blood pressure at home, it should be 120's/70's.  If you need refills for medications you take chronically, please call your pharmacy. Do not use My Chart to request refills or for acute issues that need immediate attention. If you send a my chart message, it may take a few days to be addressed, specially if I am not in the office.  Please be sure medication list is accurate. If a new problem present, please set up appointment sooner than planned today.  Health Maintenance, Female Adopting a healthy lifestyle and getting preventive care are important in promoting health and wellness. Ask your health care provider about: The right schedule for you to have regular tests and exams. Things you can do on your own to prevent diseases and keep yourself healthy. What should I know about diet, weight, and exercise? Eat a healthy diet  Eat a diet that includes plenty of vegetables, fruits, low-fat dairy products, and lean protein. Do not eat a lot of foods that are high in solid fats, added sugars, or sodium. Maintain a healthy weight Body mass index (BMI) is used to identify weight problems. It estimates body fat based on height and weight. Your health care provider can help determine your BMI and help you achieve or maintain a healthy weight. Get regular exercise Get regular exercise. This is one of the most important things you can do for your health. Most adults should: Exercise for at least 150 minutes each week. The exercise should increase your heart rate and make you sweat  (moderate-intensity exercise). Do strengthening exercises at least twice a week. This is in addition to the moderate-intensity exercise. Spend less time sitting. Even light physical activity can be beneficial. Watch cholesterol and blood lipids Have your blood tested for lipids and cholesterol at 42 years of age, then have this test every 5 years. Have your cholesterol levels checked more often if: Your lipid or cholesterol levels are high. You are older than 42 years of age. You are at high risk for heart disease. What should I know about cancer screening? Depending on your health history and family history, you may need to have cancer screening at various ages. This may include screening for: Breast cancer. Cervical cancer. Colorectal cancer. Skin cancer. Lung cancer. What should I know about heart disease, diabetes, and high blood pressure? Blood pressure and heart disease High blood pressure causes heart disease and increases the risk of stroke. This is more likely to develop in people who have high blood pressure readings or are overweight. Have your blood pressure checked: Every 3-5 years if you are 3-3 years of age. Every year if you are 26 years old or older. Diabetes Have regular diabetes screenings. This checks your fasting blood sugar level. Have the screening done: Once every three years after age 57 if you are at a normal weight and have a low risk for diabetes. More often and at a younger age if you are overweight or have a high risk for diabetes. What should I know about preventing infection? Hepatitis B If you have a higher risk for hepatitis B, you should be screened for  this virus. Talk with your health care provider to find out if you are at risk for hepatitis B infection. Hepatitis C Testing is recommended for: Everyone born from [birth year] through 1964. Anyone with known risk factors for hepatitis C. Sexually transmitted infections (STIs) Get screened for STIs,  including gonorrhea and chlamydia, if: You are sexually active and are younger than 42 years of age. You are older than 42 years of age and your health care provider tells you that you are at risk for this type of infection. Your sexual activity has changed since you were last screened, and you are at increased risk for chlamydia or gonorrhea. Ask your health care provider if you are at risk. Ask your health care provider about whether you are at high risk for HIV. Your health care provider may recommend a prescription medicine to help prevent HIV infection. If you choose to take medicine to prevent HIV, you should first get tested for HIV. You should then be tested every 3 months for as long as you are taking the medicine. Pregnancy If you are about to stop having your period (premenopausal) and you may become pregnant, seek counseling before you get pregnant. Take 400 to 800 micrograms (mcg) of folic acid every day if you become pregnant. Ask for birth control (contraception) if you want to prevent pregnancy. Osteoporosis and menopause Osteoporosis is a disease in which the bones lose minerals and strength with aging. This can result in bone fractures. If you are 59 years old or older, or if you are at risk for osteoporosis and fractures, ask your health care provider if you should: Be screened for bone loss. Take a calcium or vitamin D supplement to lower your risk of fractures. Be given hormone replacement therapy (HRT) to treat symptoms of menopause. Follow these instructions at home: Alcohol use Do not drink alcohol if: Your health care provider tells you not to drink. You are pregnant, may be pregnant, or are planning to become pregnant. If you drink alcohol: Limit how much you have to: 0-1 drink a day. Know how much alcohol is in your drink. In the [place] one drink equals one 12 oz bottle of beer (355 mL), one 5 oz glass of wine (148 mL), or one 1 oz glass of hard liquor (44  mL). Lifestyle Do not use any products that contain nicotine or tobacco. These products include cigarettes, chewing tobacco, and vaping devices, such as e-cigarettes. If you need help quitting, ask your health care provider. Do not use street drugs. Do not share needles. Ask your health care provider for help if you need support or information about quitting drugs. General instructions Schedule regular health, dental, and eye exams. Stay current with your vaccines. Tell your health care provider if: You often feel depressed. You have ever been abused or do not feel safe at home. Summary Adopting a healthy lifestyle and getting preventive care are important in promoting health and wellness. Follow your health care provider's instructions about healthy diet, exercising, and getting tested or screened for diseases. Follow your health care provider's instructions on monitoring your cholesterol and blood pressure. This information is not intended to replace advice given to you by your health care provider. Make sure you discuss any questions you have with your health care provider. Document Revised: 11/25/2020 Document Reviewed: 11/25/2020 Elsevier Patient Education  2024 [employer].

## 2023-10-08 NOTE — Assessment & Plan Note (Addendum)
 Problem has been well-controlled. Continue topiramate 100 mg daily at bedtime. S/P BTL. As far as problem is stable, annual follow-up can be continued.

## 2023-10-08 NOTE — Progress Notes (Signed)
 HPI: [name] is a 43 y.o. female with a PMHx significant for GERD, migraine, iron deficiency anemia, and prediabetes, among some, who is here today for her routine physical.  Last CPE: 01/06/2022.  She saw her gynecologist last year.   Exercise: Patient states she has been walking 5 times per week.  Diet: She is cooking at home during the week and eating out on weekends. She eats vegetables daily.  Sleep: ~5 hours per night.  Alcohol Use: none Smoking: never Vision: She does not see an eye doctor.  Dental: UTD on routine dental care.   Immunization History  Administered Date(s) Administered   Influenza Inj Mdck Quad Pf 06/17/2022   Influenza,inj,Quad PF,6+ Mos 05/06/2018, 04/29/2021   PFIZER(Purple Top)SARS-COV-2 Vaccination 04/12/2020   Tdap 02/27/2013   Health Maintenance  Topic Date Due   Cervical Cancer Screening (HPV/Pap Cotest)  11/26/2014   INFLUENZA VACCINE  02/18/2023   DTaP/Tdap/Td (2 - Td or Tdap) 02/28/2023   COVID-19 Vaccine (2 - 2024-25 season) 03/21/2023   Hepatitis C Screening  Completed   HIV Screening  Completed   HPV VACCINES  Aged Out   Chronic medical problems:   Prediabetes:  Lab Results  Component Value Date   HGBA1C 5.3 01/06/2022   Iron deficiency anemia/heavy menses:  She takes 65 mg of iron but says she is not very consistent taking it.  According to pt, she has endometrial polyps and will eventually need surgery., she cannot afford it at this time.  LMP: last week, heavy.  She was started on a OCP's by her gynecologist, does not believe is helping. Negative for pica.     Component Value Date/Time   IRON 57 07/01/2022 0756   TIBC 293 07/01/2022 0756   FERRITIN 11 (L) 07/01/2022 0756   IRONPCTSAT 19 07/01/2022 0756      Latest Ref Rng & Units 07/01/2022    7:56 AM 01/06/2022    9:05 AM 04/29/2021    8:31 AM  CBC  WBC 4.0 - 10.5 K/uL 6.8  7.1  5.9   Hemoglobin 12.0 - 15.0 g/dL 62.1  30.8  65.7   Hematocrit 36.0 - 46.0  % 39.1  33.5  32.0   Platelets 150.0 - 400.0 K/uL 222.0  261.0  252.0    Migraines:  Currently on Topiramate 100 mg daily. She says the medication is working very well for her. She doe snot have frequent headaches and tolerating medication well.  Elevated blood pressure:  Her blood pressure today is 138/80.  No hx of HTN. She does not check it at home.  No new problems or concerns today.   Review of Systems  Constitutional:  Negative for activity change, appetite change and fever.  HENT:  Negative for mouth sores, sore throat and trouble swallowing.   Eyes:  Negative for redness and visual disturbance.  Respiratory:  Negative for cough, shortness of breath and wheezing.   Cardiovascular:  Negative for chest pain and leg swelling.  Gastrointestinal:  Negative for abdominal pain, nausea and vomiting.  Endocrine: Negative for cold intolerance, heat intolerance, polydipsia, polyphagia and polyuria.  Genitourinary:  Positive for menstrual problem. Negative for decreased urine volume, dysuria and hematuria.  Musculoskeletal:  Negative for gait problem and myalgias.  Skin:  Negative for color change and rash.  Allergic/Immunologic: Negative for environmental allergies.  Neurological:  Negative for syncope, weakness and headaches.  Hematological:  Negative for adenopathy. Does not bruise/bleed easily.  Psychiatric/Behavioral:  Negative for confusion. The patient  is not nervous/anxious.   All other systems reviewed and are negative.  Current Outpatient Medications on File Prior to Visit  Medication Sig Dispense Refill   ferrous sulfate 325 (65 FE) MG tablet TAKE 1 TABLET BY MOUTH EVERY DAY 90 tablet 1   meclizine (ANTIVERT) 25 MG tablet Take 1 tablet (25 mg total) by mouth 3 (three) times daily as needed for dizziness. 30 tablet 0   topiramate (TOPAMAX) 100 MG tablet Take 1 tablet (100 mg total) by mouth daily. Due for physical/follow up before more refills can be authorized. 90 tablet 0    valACYclovir (VALTREX) 500 MG tablet Take 500 mg by mouth daily.     No current facility-administered medications on file prior to visit.   Past Medical History:  Diagnosis Date   Abnormal Pap smear    Anemia    Chicken pox    Frequent headaches    Herpes    Past Surgical History:  Procedure Laterality Date   COLPOSCOPY     LUMBAR LAMINECTOMY/DECOMPRESSION MICRODISCECTOMY N/A 01/05/2020   Procedure: RIGHT L5-S1 MICRODISCECTOMY;  Surgeon: Eldred Manges, MD;  Location: [facility];  Service: Orthopedics;  Laterality: N/A;   SALPINGOOPHORECTOMY     TUBAL LIGATION Bilateral 03/28/2013   Procedure: POST PARTUM TUBAL LIGATION;  Surgeon: Geryl Rankins, MD;  Location: [facility] ORS;  Service: Gynecology;  Laterality: Bilateral;   No Known Allergies  Family History  Problem Relation Age of Onset   Hypertension Maternal Grandmother    Hypertension Mother    Hypertension Father    Cancer Neg Hx    Social History   Socioeconomic History   Marital status: Married    Spouse name: Not on file   Number of children: Not on file   Years of education: Not on file   Highest education level: Associate degree: occupational, Scientist, product/process development, or vocational program  Occupational History   Not on file  Tobacco Use   Smoking status: Never   Smokeless tobacco: Never  Substance and Sexual Activity   Alcohol use: No   Drug use: No   Sexual activity: Not on file  Other Topics Concern   Not on file  Social History Narrative   Not on file   Social Drivers of Health   Financial Resource Strain: Low Risk  (10/06/2023)   Overall Financial Resource Strain (CARDIA)    Difficulty of Paying Living Expenses: Not hard at all  Food Insecurity: No Food Insecurity (10/06/2023)   Hunger Vital Sign    Worried About Running Out of Food in the Last Year: Never true    Ran Out of Food in the Last Year: Never true  Transportation Needs: No Transportation Needs (10/06/2023)   PRAPARE - Administrator, Civil Service  (Medical): No    Lack of Transportation (Non-Medical): No  Physical Activity: Sufficiently Active (10/06/2023)   Exercise Vital Sign    Days of Exercise per Week: 4 days    Minutes of Exercise per Session: 60 min  Stress: No Stress Concern Present (10/06/2023)   [employer] of Occupational Health - Occupational Stress Questionnaire    Feeling of Stress : Only a little  Social Connections: Moderately Isolated (10/06/2023)   Social Connection and Isolation Panel [NHANES]    Frequency of Communication with Friends and Family: Twice a week    Frequency of Social Gatherings with Friends and Family: Twice a week    Attends Religious Services: Never    Database administrator or Organizations:  No    Attends Banker Meetings: Not on file    Marital Status: Married   Today's Vitals   10/08/23 1316  BP: 138/80  Pulse: 79  Resp: 12  Temp: 98.3 F (36.8 C)  TempSrc: Oral  SpO2: 99%  Weight: 211 lb 8 oz (95.9 kg)  Height: 5\' 4"  (1.626 m)   Body mass index is 36.3 kg/m.  Wt Readings from Last 3 Encounters:  10/08/23 211 lb 8 oz (95.9 kg)  07/01/22 215 lb 2 oz (97.6 kg)  01/06/22 204 lb (92.5 kg)   Physical Exam Vitals and nursing note reviewed.  Constitutional:      General: She is not in acute distress.    Appearance: She is well-developed.  HENT:     Head: Normocephalic and atraumatic.     Right Ear: Tympanic membrane, ear canal and external ear normal.     Left Ear: Tympanic membrane, ear canal and external ear normal.     Mouth/Throat:     Mouth: Mucous membranes are moist.     Pharynx: Oropharynx is clear. Uvula midline.  Eyes:     Extraocular Movements: Extraocular movements intact.     Conjunctiva/sclera: Conjunctivae normal.     Pupils: Pupils are equal, round, and reactive to light.  Neck:     Thyroid: No thyroid mass or thyromegaly.  Cardiovascular:     Rate and Rhythm: Normal rate and regular rhythm.     Pulses:          Dorsalis pedis pulses are  2+ on the right side and 2+ on the left side.     Heart sounds: No murmur heard. Pulmonary:     Effort: Pulmonary effort is normal. No respiratory distress.     Breath sounds: Normal breath sounds.  Abdominal:     Palpations: Abdomen is soft. There is no hepatomegaly or mass.     Tenderness: There is no abdominal tenderness.  Genitourinary:    Comments: Deferred to gyn. Musculoskeletal:     Right lower leg: No edema.     Left lower leg: No edema.     Comments: No major deformity or signs of synovitis appreciated.  Lymphadenopathy:     Cervical: No cervical adenopathy.     Upper Body:     Right upper body: No supraclavicular adenopathy.     Left upper body: No supraclavicular adenopathy.  Skin:    General: Skin is warm.     Findings: No erythema or rash.  Neurological:     General: No focal deficit present.     Mental Status: She is alert and oriented to person, place, and time.     Cranial Nerves: No cranial nerve deficit.     Sensory: No sensory deficit.     Motor: No weakness.     Coordination: Coordination normal.     Gait: Gait normal.     Deep Tendon Reflexes:     Reflex Scores:      Bicep reflexes are 2+ on the right side and 2+ on the left side.      Patellar reflexes are 2+ on the right side and 2+ on the left side. Psychiatric:        Mood and Affect: Mood and affect normal.   ASSESSMENT AND PLAN:  Zoe Randall was here today for her annual physical examination.  Lab Results  Component Value Date   HGBA1C 5.6 10/08/2023   Lab Results  Component Value Date  WBC 7.9 10/08/2023   HGB 13.2 10/08/2023   HCT 40.2 10/08/2023   MCV 85.8 10/08/2023   PLT 275.0 10/08/2023   Routine general medical examination at a health care facility Assessment & Plan: We discussed the importance of regular physical activity and healthy diet for prevention of chronic illness and/or complications. Preventive guidelines reviewed. Vaccination updated, Tdap given  today. Continue her female preventive care with gynecologist. Next CPE in a year.   Prediabetes Assessment & Plan: Encouraged consistency with a healthy life style for diabetes prevention.  Orders: -     Hemoglobin A1c; Future -     Comprehensive metabolic panel; Future  Iron deficiency anemia due to chronic blood loss Assessment & Plan: Currently on iron supplementation, she acknowledges she has not been very consistent with taking medication daily. Following with gynecologist for DUB, endometrial polyp. Further recommendation will be given according to CBC and iron studies.  Orders: -     Ferritin; Future -     Iron; Future -     CBC; Future -     Comprehensive metabolic panel; Future  Migraine with aura and without status migrainosus, not intractable Assessment & Plan: Problem has been well-controlled. Continue topiramate 100 mg daily at bedtime. S/P BTL. As far as problem is stable, annual follow-up can be continued.  Orders: -     Topiramate; Take 1 tablet (100 mg total) by mouth daily. Due for physical/follow up before more refills can be authorized.  Dispense: 90 tablet; Refill: 3  Need for Tdap vaccination -     Tdap vaccine greater than or equal to 7yo IM  Return in 1 year (on 10/07/2024) for CPE.  I, Suanne Marker, acting as a scribe for Naje Rice [place], MD., have documented all relevant documentation on the behalf of Zoe Butsch [place], MD, as directed by  Anetta Olvera [place], MD while in the presence of Tyrek Lawhorn [place], MD.   I, Bryar Rennie [place], MD, have reviewed all documentation for this visit. The documentation on 10/08/23 for the exam, diagnosis, procedures, and orders are all accurate and complete.  Timothee Gali G. [place], MD  [facility]. [place] office.

## 2023-10-08 NOTE — Assessment & Plan Note (Signed)
 We discussed the importance of regular physical activity and healthy diet for prevention of chronic illness and/or complications. Preventive guidelines reviewed. Vaccination updated, Tdap given today. Continue her female preventive care with gynecologist. Next CPE in a year.

## 2023-10-08 NOTE — Assessment & Plan Note (Signed)
Encouraged consistency with a healthy life style for diabetes prevention.

## 2023-12-23 ENCOUNTER — Ambulatory Visit (INDEPENDENT_AMBULATORY_CARE_PROVIDER_SITE_OTHER): Admitting: Adult Health

## 2023-12-23 ENCOUNTER — Ambulatory Visit: Payer: Self-pay

## 2023-12-23 ENCOUNTER — Encounter: Payer: Self-pay | Admitting: Adult Health

## 2023-12-23 VITALS — BP 120/80 | HR 96 | Temp 98.2°F | Ht 64.0 in | Wt 211.0 lb

## 2023-12-23 DIAGNOSIS — R030 Elevated blood-pressure reading, without diagnosis of hypertension: Secondary | ICD-10-CM | POA: Diagnosis not present

## 2023-12-23 DIAGNOSIS — M5412 Radiculopathy, cervical region: Secondary | ICD-10-CM

## 2023-12-23 DIAGNOSIS — T148XXA Other injury of unspecified body region, initial encounter: Secondary | ICD-10-CM | POA: Diagnosis not present

## 2023-12-23 MED ORDER — METHYLPREDNISOLONE 4 MG PO TBPK: ORAL_TABLET | ORAL | 0 refills | Status: AC

## 2023-12-23 MED ORDER — CYCLOBENZAPRINE HCL 10 MG PO TABS: 10.0000 mg | ORAL_TABLET | Freq: Every day | ORAL | 0 refills | Status: AC

## 2023-12-23 NOTE — Progress Notes (Signed)
 Subjective:    Patient ID: Zoe Randall, female    DOB: 1981/05/16, 43 y.o.   MRN: [id number]  HPI Discussed the use of AI scribe software for clinical note transcription with the patient, who gave verbal consent to proceed.  History of Present Illness   Mrs. Zoe Randall is a 43 year old female who presents with elevated blood pressure and right arm pain.  She monitors her blood pressure at home with a wrist monitor, noting elevated readings. At [facility], her blood pressure readings were 160/100 mmHg, 147/95 mmHg, and another similar reading within ten minutes. At home, the wrist monitor showed 145/unknown mmHg. Her blood pressure is usually normal, and she attributes the elevation to increased stress from work responsibilities.  Last night she started to experience pain radiating from her neck down her right arm to her hand, also felt in her shoulder blade. The pain, significant enough to wake her from sleep, worsens with head movement. Intermittent numbness and mild pain in her right arm occur throughout the day, with a pain rating of 2 out of 10. There is no pain in her left arm, chest, or back of the neck, and no shortness of breath or chest pain.        Review of Systems See HPI   Past Medical History:  Diagnosis Date   Abnormal Pap smear    Anemia    Chicken pox    Frequent headaches    Herpes     Social History   Socioeconomic History   Marital status: Married    Spouse name: Not on file   Number of children: Not on file   Years of education: Not on file   Highest education level: Associate degree: occupational, Scientist, product/process development, or vocational program  Occupational History   Not on file  Tobacco Use   Smoking status: Never   Smokeless tobacco: Never  Substance and Sexual Activity   Alcohol use: No   Drug use: No   Sexual activity: Not on file  Other Topics Concern   Not on file  Social History Narrative   Not on file   Social Drivers of Health   Financial  Resource Strain: Low Risk  (10/06/2023)   Overall Financial Resource Strain (CARDIA)    Difficulty of Paying Living Expenses: Not hard at all  Food Insecurity: No Food Insecurity (10/06/2023)   Hunger Vital Sign    Worried About Running Out of Food in the Last Year: Never true    Ran Out of Food in the Last Year: Never true  Transportation Needs: No Transportation Needs (10/06/2023)   PRAPARE - Administrator, Civil Service (Medical): No    Lack of Transportation (Non-Medical): No  Physical Activity: Sufficiently Active (10/06/2023)   Exercise Vital Sign    Days of Exercise per Week: 4 days    Minutes of Exercise per Session: 60 min  Stress: No Stress Concern Present (10/06/2023)   [employer] of Occupational Health - Occupational Stress Questionnaire    Feeling of Stress : Only a little  Social Connections: Moderately Isolated (10/06/2023)   Social Connection and Isolation Panel [NHANES]    Frequency of Communication with Friends and Family: Twice a week    Frequency of Social Gatherings with Friends and Family: Twice a week    Attends Religious Services: Never    Database administrator or Organizations: No    Attends Banker Meetings: Not on file  Marital Status: Married  Catering manager Violence: Not on file    Past Surgical History:  Procedure Laterality Date   COLPOSCOPY     LUMBAR LAMINECTOMY/DECOMPRESSION MICRODISCECTOMY N/A 01/05/2020   Procedure: RIGHT L5-S1 MICRODISCECTOMY;  Surgeon: Adah Acron, MD;  Location: [facility] OR;  Service: Orthopedics;  Laterality: N/A;   SALPINGOOPHORECTOMY     TUBAL LIGATION Bilateral 03/28/2013   Procedure: POST PARTUM TUBAL LIGATION;  Surgeon: Johnn Najjar, MD;  Location: [facility];  Service: Gynecology;  Laterality: Bilateral;    Family History  Problem Relation Age of Onset   Hypertension Maternal Grandmother    Hypertension Mother    Hypertension Father    Cancer Neg Hx     No Known Allergies  Current  Outpatient Medications on File Prior to Visit  Medication Sig Dispense Refill   ferrous sulfate  325 (65 FE) MG tablet TAKE 1 TABLET BY MOUTH EVERY DAY 90 tablet 1   meclizine  (ANTIVERT ) 25 MG tablet Take 1 tablet (25 mg total) by mouth 3 (three) times daily as needed for dizziness. 30 tablet 0   topiramate  (TOPAMAX ) 100 MG tablet Take 1 tablet (100 mg total) by mouth daily. Due for physical/follow up before more refills can be authorized. 90 tablet 3   valACYclovir (VALTREX) 500 MG tablet Take 500 mg by mouth daily.     No current facility-administered medications on file prior to visit.    BP 120/80   Pulse 96   Temp 98.2 F (36.8 C) (Oral)   Ht 5\' 4"  (1.626 m)   Wt 211 lb (95.7 kg)   SpO2 99%   BMI 36.22 kg/m       Objective:   Physical Exam Vitals and nursing note reviewed.  Constitutional:      Appearance: Normal appearance.  Cardiovascular:     Rate and Rhythm: Normal rate and regular rhythm.     Pulses: Normal pulses.     Heart sounds: Normal heart sounds.  Musculoskeletal:       Arms:  Skin:    General: Skin is warm and dry.  Neurological:     General: No focal deficit present.     Mental Status: She is alert and oriented to person, place, and time.  Psychiatric:        Mood and Affect: Mood normal.        Behavior: Behavior normal.        Thought Content: Thought content normal.           Assessment & Plan:  1. Cervical radiculopathy (Primary) - Likely from tight trapezius. Will send in Flexeril and medrol dose pack. Advised massage therapy and heat as well. Follow up if not resolved in the next few days  - cyclobenzaprine (FLEXERIL) 10 MG tablet; Take 1 tablet (10 mg total) by mouth at bedtime.  Dispense: 15 tablet; Refill: 0 - methylPREDNISolone (MEDROL DOSEPAK) 4 MG TBPK tablet; Take as directed  Dispense: 21 tablet; Refill: 0  2. Muscle strain  - cyclobenzaprine (FLEXERIL) 10 MG tablet; Take 1 tablet (10 mg total) by mouth at bedtime.  Dispense:  15 tablet; Refill: 0 - methylPREDNISolone (MEDROL DOSEPAK) 4 MG TBPK tablet; Take as directed  Dispense: 21 tablet; Refill: 0  3. Elevated blood pressure reading - Normal in the office on two separate reads. Advised wrist cuffs and blood pressure monitors at drug stores are not always accurate. Can get upper arm blood pressure cuff. Follow up with PCP if BP elevated at home  Alto Atta, NP  Time spent with patient today was 31 minutes which consisted of chart review, discussing elevated BP, cervical radiculopathy and muscle strain,  work up, treatment answering questions and documentation.

## 2023-12-23 NOTE — Telephone Encounter (Signed)
 Copied from CRM [id number]. Topic: Clinical - Red Word Triage >> Dec 23, 2023  1:23 PM Zoe Randall wrote: Red Word that prompted transfer to Nurse Triage: BP high, pain in right arm   FYI Only or Action Required?: FYI only for provider  Patient was last seen in primary care on 10/08/2023 by [place], Betty G, MD. Called Nurse Triage reporting Hypertension. Symptoms began today. Interventions attempted: Nothing. Symptoms are: unchanged.  Triage Disposition: See PCP When Office is Open (Within 3 Days)  Patient/caregiver understands and will follow disposition?: Yes    Reason for Disposition  Systolic BP  >= 160 OR Diastolic >= 100  Answer Assessment - Initial Assessment Questions 1. BLOOD PRESSURE: "What is the blood pressure?" "Did you take at least two measurements 5 minutes apart?"     160/100, 148/99, 147/95 2. ONSET: "When did you take your blood pressure?"     Prior to calling  3. HOW: "How did you take your blood pressure?" (e.g., automatic home BP monitor, visiting nurse)     Automatic BP cuff 4. HISTORY: "Do you have a history of high blood pressure?"     No 5. MEDICINES: "Are you taking any medicines for blood pressure?" "Have you missed any doses recently?"     No 6. OTHER SYMPTOMS: "Do you have any symptoms?" (e.g., blurred vision, chest pain, difficulty breathing, headache, weakness)     Pain in right arm  Patient denies any chest pain, shortness of breath, or neurologic symptom  Protocols used: Blood Pressure - High-A-AH
